# Patient Record
Sex: Female | Born: 1940 | Race: White | Hispanic: No | Smoking: Current some day smoker
Health system: Southern US, Academic
[De-identification: ages and names within clinical notes are randomized; demographics above are authoritative.]

## PROBLEM LIST (undated history)

## (undated) DIAGNOSIS — J449 Chronic obstructive pulmonary disease, unspecified: Secondary | ICD-10-CM

## (undated) DIAGNOSIS — I639 Cerebral infarction, unspecified: Secondary | ICD-10-CM

## (undated) DIAGNOSIS — I252 Old myocardial infarction: Secondary | ICD-10-CM

## (undated) DIAGNOSIS — I10 Essential (primary) hypertension: Secondary | ICD-10-CM

## (undated) HISTORY — PX: HX TAH AND BSO: SHX83

## (undated) HISTORY — PX: HX APPENDECTOMY: SHX54

---

## 1994-05-26 ENCOUNTER — Other Ambulatory Visit (HOSPITAL_COMMUNITY): Payer: Self-pay

## 2021-05-07 ENCOUNTER — Encounter (HOSPITAL_PSYCHIATRIC): Payer: Self-pay | Admitting: PHYSICIAN ASSISTANT

## 2021-05-07 DIAGNOSIS — F410 Panic disorder [episodic paroxysmal anxiety] without agoraphobia: Secondary | ICD-10-CM | POA: Insufficient documentation

## 2021-05-07 DIAGNOSIS — F411 Generalized anxiety disorder: Secondary | ICD-10-CM | POA: Insufficient documentation

## 2021-05-07 DIAGNOSIS — F39 Unspecified mood [affective] disorder: Secondary | ICD-10-CM

## 2021-05-07 DIAGNOSIS — G4700 Insomnia, unspecified: Secondary | ICD-10-CM | POA: Insufficient documentation

## 2021-05-07 NOTE — Progress Notes (Signed)
HPI  Behavioral Health-AMB  HPI: Ms. Michele Fitzgerald is a 81 year old white female who is here to follow-up for ongoing treatment of anxiety and depression.  This is the patient's first office visit since being in the hospital.  The patient was in the hospital here in September and medications were added and adjusted.  The patient was placed on Ativan which has helped her a great deal.  She has end-stage COPD and struggling a lot with breathing, she is on continuous oxygen and takes breathing treatments regularly.  Today, the patient is very jovial and seemingly well controlled.  She does not appear to be depressed or anxious.  We talked about her medications and talked about the methodology and pathophysiology of her COPD.  She seems to have a good grasp on what is going on with her and why she is so sick and when she gets overwhelmed.  We talked about new coping skills and breathing techniques.    PHQ-9 score today is 13      Diagnosis:    Axis 1:1.  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  Generalized anxiety disorder, 3.  Insomnia 4.  Mood disorder unspecified    Axis 2: Deferred    Axis 3: Allergy to aspirin, codeine, propoxyphene, opiate agonists, Darvon.  History of COPD and uses O2, CVA, hyperlipidemia, hypertension, hypokalemia, hyponatremia, COVID-19.  Surgical history includes hysterectomy, cataract removal, C-section.  No history of head trauma, LOC or seizure disorder.      A/P  Assessment & Plan  (1) Panic disorder:        Status: Acute        Plan:  Moderate level of medical decision making, discussion of multiple diagnosis and symptoms, review of PHQ-9, review of symptoms, patient education, discussion of prescribed medications and potential side effects, discussion of psychosocial stressors, and review of prescription monitoring program information.  Continue with current medications, follow-up in 4 months.        Code(s):  F41.0 - Panic disorder [episodic paroxysmal anxiety]  (2) GAD (generalized anxiety  disorder):        Status: Acute        Code(s):  F41.1 - Generalized anxiety disorder  (3) Mood disorder:        Status: Acute        Code(s):  F39 - Unspecified mood [affective] disorder  (4) Insomnia disorder:        Status: Acute        Code(s):  G47.00 - Insomnia, unspecified        Qualifiers:          Insomnia type: primary  Qualified Code(s): F51.01 - Primary insomnia

## 2021-06-01 ENCOUNTER — Ambulatory Visit: Payer: Medicare Other | Attending: PHYSICIAN ASSISTANT | Admitting: PHYSICIAN ASSISTANT

## 2021-06-01 ENCOUNTER — Encounter (HOSPITAL_PSYCHIATRIC): Payer: Self-pay | Admitting: PHYSICIAN ASSISTANT

## 2021-06-01 ENCOUNTER — Other Ambulatory Visit: Payer: Self-pay

## 2021-06-01 VITALS — BP 124/68 | HR 64 | Resp 20 | Ht 60.0 in | Wt 124.0 lb

## 2021-06-01 DIAGNOSIS — F411 Generalized anxiety disorder: Secondary | ICD-10-CM | POA: Insufficient documentation

## 2021-06-01 DIAGNOSIS — F410 Panic disorder [episodic paroxysmal anxiety] without agoraphobia: Secondary | ICD-10-CM | POA: Insufficient documentation

## 2021-06-01 DIAGNOSIS — G4700 Insomnia, unspecified: Secondary | ICD-10-CM

## 2021-06-01 DIAGNOSIS — F39 Unspecified mood [affective] disorder: Secondary | ICD-10-CM | POA: Insufficient documentation

## 2021-06-01 MED ORDER — TRAZODONE 50 MG TABLET
50.0000 mg | ORAL_TABLET | Freq: Every evening | ORAL | 2 refills | Status: DC
Start: 2021-06-01 — End: 2021-08-02

## 2021-06-01 MED ORDER — ROPINIROLE 0.5 MG TABLET
0.5000 mg | ORAL_TABLET | Freq: Two times a day (BID) | ORAL | 2 refills | Status: DC
Start: 2021-06-01 — End: 2021-08-02

## 2021-06-01 MED ORDER — LORAZEPAM 0.5 MG TABLET
0.5000 mg | ORAL_TABLET | Freq: Two times a day (BID) | ORAL | 2 refills | Status: DC | PRN
Start: 2021-06-01 — End: 2021-08-02

## 2021-06-01 MED ORDER — FLUOXETINE 10 MG CAPSULE
10.0000 mg | ORAL_CAPSULE | Freq: Every day | ORAL | 2 refills | Status: DC
Start: 2021-06-01 — End: 2021-08-02

## 2021-06-01 NOTE — Progress Notes (Signed)
HPI  Behavioral Health-AMB  HPI: Michele Fitzgerald is a 81 year old white female who is here to follow-up for ongoing treatment of anxiety and depression.   patient presents today with her cousin.  She states her blood pressure is still difficult to control.  She asked me if her Prozac or any of her other medications could be causing her blood pressure issues.  I told her that not suspect 10 mg of Prozac to be lowering her pressure.  I told her that Ativan can certainly lower pressure however.  I will keep her at 1 mg daily for Requip for restless leg issues.  We will do 0.5 b.i.d..       PHQ-9 score today is 13      Diagnosis:    Axis 1:1.  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  Generalized anxiety disorder, 3.  Insomnia 4.  Mood disorder unspecified    Axis 2: Deferred    Axis 3: Allergy to aspirin, codeine, propoxyphene, opiate agonists, Darvon.  History of COPD and uses O2, CVA, hyperlipidemia, hypertension, hypokalemia, hyponatremia, COVID-19.  Surgical history includes hysterectomy, cataract removal, C-section.  No history of head trauma, LOC or seizure disorder.    Review of Systems:     All systems reviewed & are unremarkable except as noted in HPI and below  Constitutional: Yes alert, Yes alert and oriented x4 and Yes appears well  HENT: Yes normal HENT inspection and Yes hearing grossly normal bilaterally  Respiratory: Yes normal respiratory effort, No no respiratory distress and Yes lungs clear  Cardiovascular:  No cardiac complaints, no chest pain  Gastrointestinal:  No GI complaints  Skin:  Warm,  dry, no rashes  Neurological:  No focal neurological deficits        MSE:  The patient is alert and oriented x4, casually dressed, good eye contact, well groomed, appearing stated age.  Speech is normal rate and tone.  Patient is talkative and personable. There is no flight of ideas, loosening of associations, or tangential speech.  Not manic.  Mood is euthymic with no complaints.  Affect congruent.   Patient does not appear to be in any acute physical distress.  No suicidal or homicidal ideation.  No auditory or visual hallucinations, no delusions, no paranoia.  No signs of psychosis.  No plans to harm self or others.  Patient is not aggressive or threatening.  No psychomotor agitation.  No psychomotor retardation.  No abnormal involuntary movements. Thoughts are linear, logical, and goal directed.  Intellectual functioning is good.  Memory is intact to recent, remote, and past events.  Patient can recall 3 of 3 objects at 0 and 5 minutes, and what was eaten for last meal.  Patient is able to provide details of current situation.  Patient can name the president, vice president, and governor.  Language is good.  Vocabulary is unimpaired, no word finding difficulty or word misuse.  Intelligence is good, patient can interpret a proverb, and reports apple and orange similarity.  Calculation is unimpaired.  Concentration is good, able to recite days of week forward and backward.  Insight is good; patient is aware of their illness, how it affects their functioning, and what needs to happen for future improvement.  Judgment is good; patient is compliant with treatment and can relate appropriately to what they would do if smelling smoke in a theater or finding stamped addressed envelope.            A/P  Assessment & Plan  (1)  Panic disorder:        Status: Acute        Plan:        Code(s):  F41.0 - Panic disorder [episodic paroxysmal anxiety]  (2) GAD (generalized anxiety disorder):        Status: Acute        Code(s):  F41.1 - Generalized anxiety disorder  (3) Mood disorder:        Status: Acute        Code(s):  F39 - Unspecified mood [affective] disorder  (4) Insomnia disorder:        Status: Acute        Code(s):  G47.00 - Insomnia, unspecified        Qualifiers:          Insomnia type: primary  Qualified Code(s): F51.01 - Primary insomnia

## 2021-06-01 NOTE — Patient Instructions (Addendum)
Moderate level of medical decision making, discussion of multiple diagnosis and symptoms, review of PHQ-9, review of symptoms, patient education, discussion of prescribed medications and potential side effects, discussion of psychosocial stressors, and review of prescription monitoring program information.  Continue with current medications, follow-up in 2 months.  Increase Requip to 0.5 mg b.i.d. for now

## 2021-07-02 ENCOUNTER — Telehealth (HOSPITAL_PSYCHIATRIC): Payer: Self-pay | Admitting: PHYSICIAN ASSISTANT

## 2021-07-02 NOTE — Telephone Encounter (Signed)
Patient called and is needing a refill for Ativan.    [facility]      [phone]

## 2021-07-02 NOTE — Telephone Encounter (Signed)
Called [facility] in [place] because a prescription should be on file. Per pharmacy tech, prescription was on file and they will fill prescription for patient. Patient called and notified of this and she verbalized understanding.

## 2021-08-02 ENCOUNTER — Ambulatory Visit: Payer: Medicare Other | Attending: PHYSICIAN ASSISTANT | Admitting: PHYSICIAN ASSISTANT

## 2021-08-02 ENCOUNTER — Encounter (HOSPITAL_PSYCHIATRIC): Payer: Self-pay | Admitting: PHYSICIAN ASSISTANT

## 2021-08-02 ENCOUNTER — Other Ambulatory Visit: Payer: Self-pay

## 2021-08-02 VITALS — BP 126/78 | HR 78 | Resp 20 | Ht 60.0 in | Wt 125.0 lb

## 2021-08-02 DIAGNOSIS — F39 Unspecified mood [affective] disorder: Secondary | ICD-10-CM | POA: Insufficient documentation

## 2021-08-02 DIAGNOSIS — G4700 Insomnia, unspecified: Secondary | ICD-10-CM | POA: Insufficient documentation

## 2021-08-02 DIAGNOSIS — F411 Generalized anxiety disorder: Secondary | ICD-10-CM | POA: Insufficient documentation

## 2021-08-02 DIAGNOSIS — F410 Panic disorder [episodic paroxysmal anxiety] without agoraphobia: Secondary | ICD-10-CM | POA: Insufficient documentation

## 2021-08-02 MED ORDER — LORAZEPAM 0.5 MG TABLET
0.5000 mg | ORAL_TABLET | Freq: Two times a day (BID) | ORAL | 2 refills | Status: DC | PRN
Start: 2021-08-02 — End: 2021-11-02

## 2021-08-02 MED ORDER — TRAZODONE 50 MG TABLET
50.0000 mg | ORAL_TABLET | Freq: Every evening | ORAL | 2 refills | Status: DC
Start: 2021-08-02 — End: 2022-05-03

## 2021-08-02 MED ORDER — ROPINIROLE 0.5 MG TABLET
0.5000 mg | ORAL_TABLET | Freq: Two times a day (BID) | ORAL | 2 refills | Status: DC
Start: 2021-08-02 — End: 2022-02-01

## 2021-08-02 MED ORDER — FLUOXETINE 10 MG CAPSULE
10.0000 mg | ORAL_CAPSULE | Freq: Every day | ORAL | 2 refills | Status: DC
Start: 2021-08-02 — End: 2021-11-02

## 2021-08-02 NOTE — Progress Notes (Signed)
[facility]  BEHAVIORAL MEDICINE, [facility] [place]  Operated by [facility]  Progress Note    Name: Michele Fitzgerald MRN:  [id number]   Date: 08/02/2021 Age: 81 y.o.       Chief Complaint: Generalized Anxiety, Panic Disorder, Insomnia, and Depression (Mild Mood Disorder)    Subjective:   HPI  Behavioral Health-AMB  HPI:   Michele Fitzgerald is a 81 year old white female who is here to follow-up for ongoing treatment of anxiety and depression.   patient states she is doing pretty good today.  She states that she is doing well with her medications and she is less agitated less restless.  She admits to me today that she did not quit smoking delay the doctors wanted her to.  She did not feel like it was right lying to me.  She had been struggling with blood pressure issues now thinks that she would get really anxious when she came to the physician because of that.  Her blood pressure today is 126/78.  I will continue with current medications, will see her back in 3 months.  Sooner if needed.     PHQ-9 score today is 8    Review of Systems:     All systems reviewed & are unremarkable except as noted in HPI and below  Constitutional: Yes alert, Yes alert and oriented x4 and Yes appears well  HENT: Yes normal HENT inspection and Yes hearing grossly normal bilaterally  Respiratory: Yes normal respiratory effort, No no respiratory distress and Yes lungs clear  Cardiovascular:  No cardiac complaints, no chest pain  Gastrointestinal:  No GI complaints  Skin:  Warm,  dry, no rashes  Neurological:  No focal neurological deficits      MSE:  The patient is alert and oriented x4, casually dressed, good eye contact, well groomed, appearing stated age.  Speech is normal rate and tone.  Patient is talkative and personable. There is no flight of ideas, loosening of associations, or tangential speech.  Not manic.  Mood is euthymic with no complaints.  Affect congruent.  Patient does not appear to  be in any acute physical distress.  No suicidal or homicidal ideation.  No auditory or visual hallucinations, no delusions, no paranoia.  No signs of psychosis.  No plans to harm self or others.  Patient is not aggressive or threatening.  No psychomotor agitation.  No psychomotor retardation.  No abnormal involuntary movements. Thoughts are linear, logical, and goal directed.  Intellectual functioning is good.  Memory is intact to recent, remote, and past events.  Patient can recall 3 of 3 objects at 0 and 5 minutes, and what was eaten for last meal.  Patient is able to provide details of current situation.  Patient can name the president, vice president, and governor.  Language is good.  Vocabulary is unimpaired, no word finding difficulty or word misuse.  Intelligence is good, patient can interpret a proverb, and reports apple and orange similarity.  Calculation is unimpaired.  Concentration is good, able to recite days of week forward and backward.  Insight is good; patient is aware of their illness, how it affects their functioning, and what needs to happen for future improvement.  Judgment is good; patient is compliant with treatment and can relate appropriately to what they would do if smelling smoke in a theater or finding stamped addressed envelope.          Diagnosis:    Axis 1:1.  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  Generalized anxiety disorder, 3.  Insomnia 4.  Mood disorder unspecified    Axis 2: Deferred    Axis 3: Allergy to aspirin, codeine, propoxyphene, opiate agonists, Darvon.  History of COPD and uses O2, CVA, hyperlipidemia, hypertension, hypokalemia, hyponatremia, COVID-19.  Surgical history includes hysterectomy, cataract removal, C-section.  No history of head trauma, LOC or seizure disorder.    Objective :  BP 126/78 (Site: Right, Patient Position: Sitting)   Pulse 78   Resp 20   Ht 1.524 m (5')   Wt 56.7 kg (125 lb)   SpO2 99% Comment: 3LMP O2  BMI 24.41 kg/m     Data  reviewed:    Current Outpatient Medications   Medication Sig   . albuterol sulfate (PROVENTIL OR VENTOLIN OR PROAIR) 90 mcg/actuation Inhalation oral inhaler Take 1-2 Puffs by inhalation Every 6 hours as needed   . ascorbic acid, vitamin C, (VITAMIN C) 500 mg Oral Tablet Take 1 Tablet (500 mg total) by mouth Once a day   . budesonide-glycopyr-formoterol (BREZTRI AEROSPHERE) 160-9-4.8 mcg/actuation Inhalation HFA Aerosol Inhaler Take 2 Puffs by inhalation Twice daily   . clopidogreL (PLAVIX) 75 mg Oral Tablet Take 1 Tablet (75 mg total) by mouth Once a day   . cyanocobalamin (VITAMIN B 12) 1,000 mcg Oral Tablet Take 1 Tablet (1,000 mcg total) by mouth Once a day   . FLUoxetine (PROZAC) 10 mg Oral Capsule Take 1 Capsule (10 mg total) by mouth Once a day   . hydroCHLOROthiazide (HYDRODIURIL) 25 mg Oral Tablet Take 1 Tablet (25 mg total) by mouth Once a day   . IPRATROPIUM 0.5 MG-ALBUTEROL 3 MG (2.5 MG BASE)/3 ML NEBULIZATION SOLN Take 3 mL by nebulization Every 6 hours as needed for Wheezing   . LORazepam (ATIVAN) 0.5 mg Oral Tablet Take 1 Tablet (0.5 mg total) by mouth Twice per day as needed for Anxiety   . Potassium Gluconate 2.5 mEq Oral Tablet Take 1 Tablet (2.5 mEq total) by mouth Once a day   . rOPINIRole (REQUIP) 0.5 mg Oral Tablet Take 1 Tablet (0.5 mg total) by mouth Twice daily   . rosuvastatin (CRESTOR) 20 mg Oral Tablet Take 1 Tablet (20 mg total) by mouth Every evening   . traZODone (DESYREL) 50 mg Oral Tablet Take 1 Tablet (50 mg total) by mouth Every night     Assessment/Plan  Moderate level of medical decision making, discussion of multiple diagnosis and symptoms, review of PHQ-9, review of symptoms, patient education, discussion of prescribed medications and potential side effects, discussion of psychosocial stressors, and review of prescription monitoring program information.    Take medications as prescribed, avoid drugs and alcohol, call office if symptoms worsen or problems arise.   [phone].      Lou Cal, PA-C

## 2021-11-02 ENCOUNTER — Encounter (HOSPITAL_PSYCHIATRIC): Payer: Self-pay | Admitting: PHYSICIAN ASSISTANT

## 2021-11-02 ENCOUNTER — Other Ambulatory Visit: Payer: Self-pay

## 2021-11-02 ENCOUNTER — Ambulatory Visit: Payer: Medicare Other | Attending: PHYSICIAN ASSISTANT | Admitting: PHYSICIAN ASSISTANT

## 2021-11-02 VITALS — BP 140/76 | HR 80 | Resp 28 | Ht 60.0 in | Wt 124.0 lb

## 2021-11-02 DIAGNOSIS — F39 Unspecified mood [affective] disorder: Secondary | ICD-10-CM | POA: Insufficient documentation

## 2021-11-02 DIAGNOSIS — G4700 Insomnia, unspecified: Secondary | ICD-10-CM | POA: Insufficient documentation

## 2021-11-02 DIAGNOSIS — F411 Generalized anxiety disorder: Secondary | ICD-10-CM | POA: Insufficient documentation

## 2021-11-02 DIAGNOSIS — F410 Panic disorder [episodic paroxysmal anxiety] without agoraphobia: Secondary | ICD-10-CM | POA: Insufficient documentation

## 2021-11-02 MED ORDER — FLUOXETINE 10 MG CAPSULE
10.0000 mg | ORAL_CAPSULE | Freq: Every day | ORAL | 2 refills | Status: DC
Start: 2021-11-02 — End: 2022-02-01

## 2021-11-02 MED ORDER — LORAZEPAM 0.5 MG TABLET
0.5000 mg | ORAL_TABLET | Freq: Three times a day (TID) | ORAL | 2 refills | Status: DC | PRN
Start: 2021-11-02 — End: 2022-02-01

## 2021-11-02 NOTE — Progress Notes (Signed)
[facility]  BEHAVIORAL MEDICINE, [facility] [place]  Operated by [facility]  Progress Note    Name: Michele Fitzgerald MRN:  [id number]   Date: 11/02/2021 Age: 81 y.o.       Chief Complaint: Generalized Anxiety, Panic Disorder, and Insomnia    Subjective:     HPI:  Ms. Likes is   here to follow-up for management of panic disorder and generalized anxiety.  Patient states he has been having a lot more anxiety and is tremulous today in my clinic.  She has COPD and struggles with breathing and has nervous tendencies of her medical issues.  She is asking me to increase her Ativan today which I think is reasonable.  She just picked up her Ativan this past Sunday 2 days ago.  I will notify the pharmacy to see if they will refill her extra.  I will see her back in 3 months, she knows to call the office sooner should she have any problems complications.      PHQ-9 score today is 7    Review of Systems:     All systems reviewed & are unremarkable except as noted in HPI and below  Constitutional: Yes alert, Yes alert and oriented x4 and Yes appears well  HENT: Yes normal HENT inspection and Yes hearing grossly normal bilaterally  Respiratory: Yes normal respiratory effort, No no respiratory distress and Yes lungs clear  Cardiovascular:  No cardiac complaints, no chest pain  Gastrointestinal:  No GI complaints  Skin:  Warm,  dry, no rashes  Neurological:  No focal neurological deficits      MSE:  The patient is alert and oriented x4, casually dressed, good eye contact, well groomed, appearing stated age.  Speech is normal rate and tone.  Patient is talkative and personable. There is no flight of ideas, loosening of associations, or tangential speech.  Not manic.  Mood is euthymic with no complaints.  Affect congruent.  Patient does not appear to be in any acute physical distress.  No suicidal or homicidal ideation.  No auditory or visual hallucinations, no delusions, no paranoia.   No signs of psychosis.  No plans to harm self or others.  Patient is not aggressive or threatening.  No psychomotor agitation.  No psychomotor retardation.  No abnormal involuntary movements. Thoughts are linear, logical, and goal directed.  Intellectual functioning is good.  Memory is intact to recent, remote, and past events.  Patient can recall 3 of 3 objects at 0 and 5 minutes, and what was eaten for last meal.  Patient is able to provide details of current situation.  Patient can name the president, vice president, and governor.  Language is good.  Vocabulary is unimpaired, no word finding difficulty or word misuse.  Intelligence is good, patient can interpret a proverb, and reports apple and orange similarity.  Calculation is unimpaired.  Concentration is good, able to recite days of week forward and backward.  Insight is good; patient is aware of their illness, how it affects their functioning, and what needs to happen for future improvement.  Judgment is good; patient is compliant with treatment and can relate appropriately to what they would do if smelling smoke in a theater or finding stamped addressed envelope.            Diagnosis:     Axis 1:1.  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  Generalized anxiety disorder, 3.  Insomnia 4.  Mood disorder unspecified  Axis 2: Deferred     Axis 3: Allergy to aspirin, codeine, propoxyphene, opiate agonists, Darvon.  History of COPD and uses O2, CVA, hyperlipidemia, hypertension, hypokalemia, hyponatremia, COVID-19.  Surgical history includes hysterectomy, cataract removal, C-section.  No history of head trauma, LOC or seizure disorder.    Allergies:  Darvon  Bactrim  Codine  Vistaril  Asprin    Medication  Requip 0.5mg  BID  Ativan 0.5mg  TID  Prozac 10 mg Daily  Trazodone 50 mg HS      Pharmacy:  [facility]      Objective :  BP (!) 140/76 (Site: Left, Patient Position: Sitting, Cuff Size: Adult)   Pulse 80   Resp (!) 28   Ht 1.524 m (5')   Wt 56.2  kg (124 lb)   BMI 24.22 kg/m     Data reviewed:    Current Outpatient Medications   Medication Sig    albuterol sulfate (PROVENTIL OR VENTOLIN OR PROAIR) 90 mcg/actuation Inhalation oral inhaler Take 1-2 Puffs by inhalation Every 6 hours as needed    ascorbic acid, vitamin C, (VITAMIN C) 500 mg Oral Tablet Take 1 Tablet (500 mg total) by mouth Once a day    budesonide-glycopyr-formoterol (BREZTRI AEROSPHERE) 160-9-4.8 mcg/actuation Inhalation HFA Aerosol Inhaler Take 2 Puffs by inhalation Twice daily    clopidogreL (PLAVIX) 75 mg Oral Tablet Take 1 Tablet (75 mg total) by mouth Once a day    cyanocobalamin (VITAMIN B 12) 1,000 mcg Oral Tablet Take 1 Tablet (1,000 mcg total) by mouth Once a day    FLUoxetine (PROZAC) 10 mg Oral Capsule Take 1 Capsule (10 mg total) by mouth Once a day    hydroCHLOROthiazide (HYDRODIURIL) 25 mg Oral Tablet Take 1 Tablet (25 mg total) by mouth Once a day    IPRATROPIUM 0.5 MG-ALBUTEROL 3 MG (2.5 MG BASE)/3 ML NEBULIZATION SOLN Take 3 mL by nebulization Every 6 hours as needed for Wheezing    LORazepam (ATIVAN) 0.5 mg Oral Tablet Take 1 Tablet (0.5 mg total) by mouth Twice per day as needed for Anxiety    Potassium Gluconate 2.5 mEq Oral Tablet Take 1 Tablet (2.5 mEq total) by mouth Once a day    rOPINIRole (REQUIP) 0.5 mg Oral Tablet Take 1 Tablet (0.5 mg total) by mouth Twice daily    rosuvastatin (CRESTOR) 20 mg Oral Tablet Take 1 Tablet (20 mg total) by mouth Every evening    traZODone (DESYREL) 50 mg Oral Tablet Take 1 Tablet (50 mg total) by mouth Every night     Assessment/Plan  Moderate level of medical decision making, discussion of multiple diagnosis and symptoms, review of PHQ-9, review of symptoms, patient education, discussion of prescribed medications and potential side effects, discussion of psychosocial stressors, and review of prescription monitoring program information.    Increase Ativan 0.5 TID    Take medications as prescribed, avoid drugs and alcohol, call office  if symptoms worsen or problems arise.  [phone].    Called [facility] pharmacy and the pharmacist told me that the patient can start taking her medication 3 times a day in when she runs out she can come get the new prescription filled.      Lou Cal, PA-C

## 2021-12-02 IMAGING — MR MRI BRAIN W/O CONTRAST
9 series · 48 of 48 positions shown · non-contrast
Comparison: None available.

﻿EXAM:  MRI BRAIN W/O CONTRAST
INDICATION: Amnesia, headaches.  History of new onset of Parkinson’s.
TECHNIQUE: Noncontrast multiplanar, multisequence MRI was performed.

[Series 5: DWI · axial · 5.0mm · 1.35mm/px · z∈[-30,+96]mm · 16 of 88 slices shown (1 of 3)]
[im 1/88]
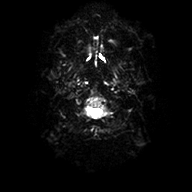
[im 6/88]
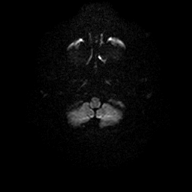
[im 12/88]
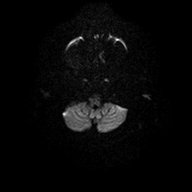
[im 18/88]
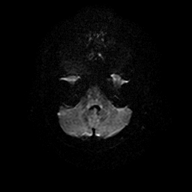
[im 24/88]
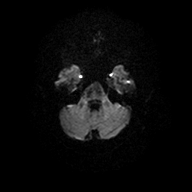
[im 30/88]
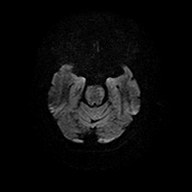
[im 35/88]
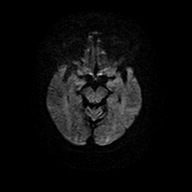
[im 41/88]
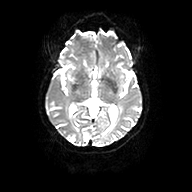
[im 47/88]
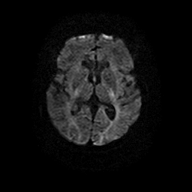
[im 53/88]
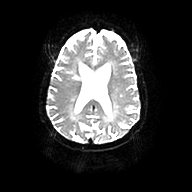
[im 59/88]
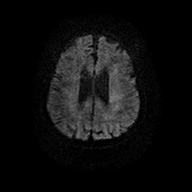
[im 64/88]
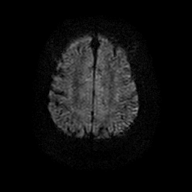
[im 70/88]
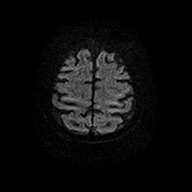
[im 76/88]
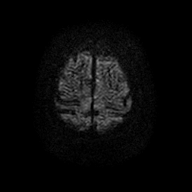
[im 82/88]
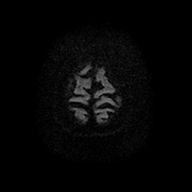
[im 88/88]
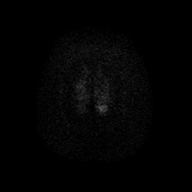

[Series 6: DWI · axial · 5.0mm · 1.35mm/px · z∈[-30,+96]mm · 4 of 22 slices shown (2 of 3)]
[im 1/22]
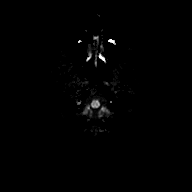
[im 8/22]
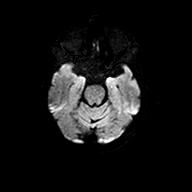
[im 15/22]
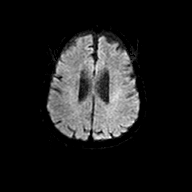
[im 22/22]
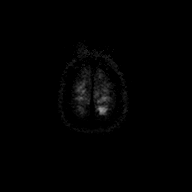

[Series 7: DWI · axial · 5.0mm · 1.35mm/px · z∈[-30,+96]mm · 4 of 22 slices shown (3 of 3)]
[im 1/22]
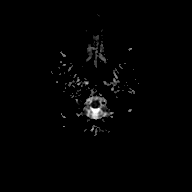
[im 8/22]
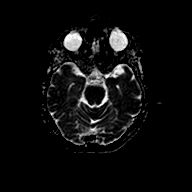
[im 15/22]
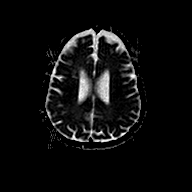
[im 22/22]
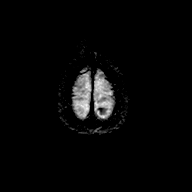

[Series 8: FLAIR · sagittal · 4.0mm · 0.75mm/px · 4 of 26 slices shown (1 of 2)]
[im 1/26]
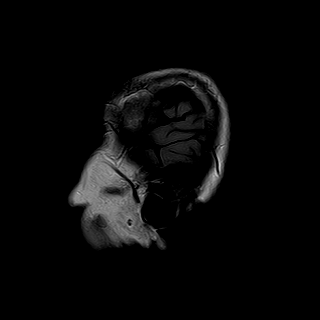
[im 9/26]
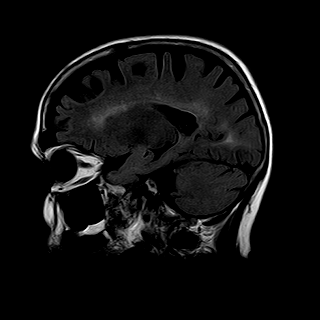
[im 17/26]
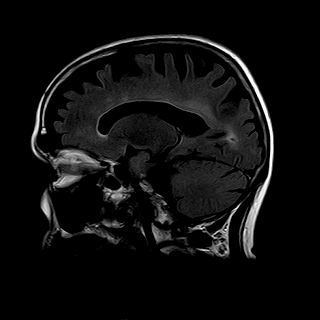
[im 26/26]
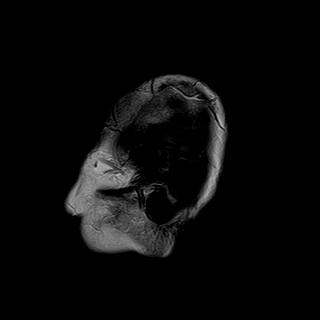

[Series 9: T2 · axial · 5.0mm · 0.43mm/px · z∈[-37,+107]mm · 4 of 25 slices shown (1 of 2)]
[im 1/25]
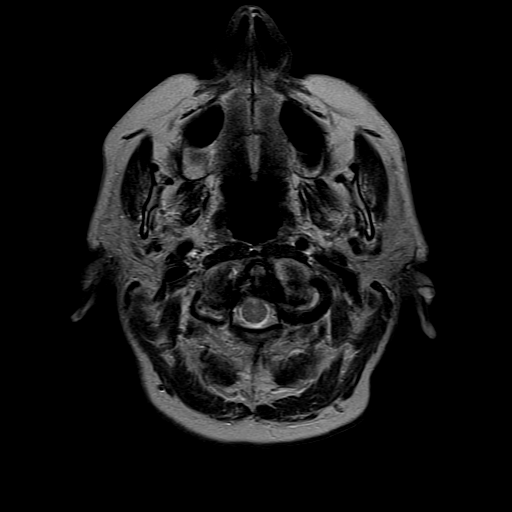
[im 9/25]
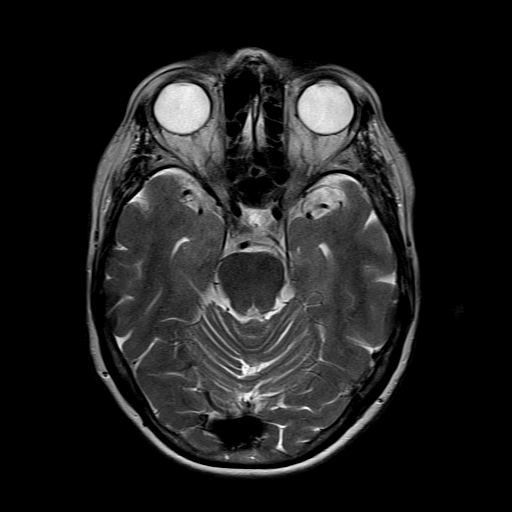
[im 17/25]
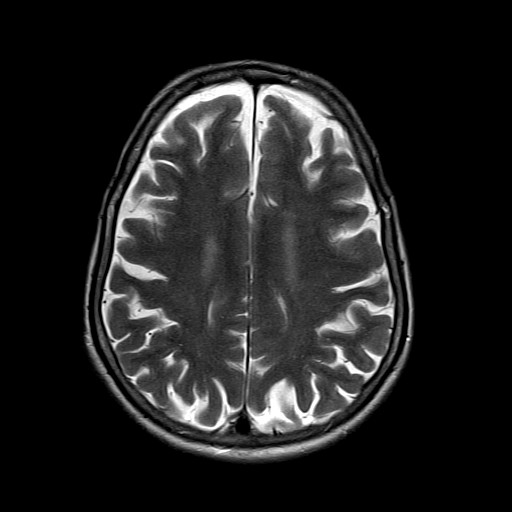
[im 25/25]
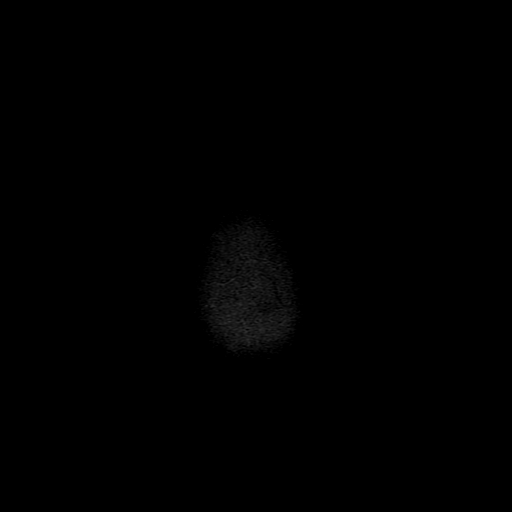

[Series 10: FLAIR · axial · 5.0mm · 0.76mm/px · z∈[-28,+98]mm · 4 of 22 slices shown (2 of 2)]
[im 1/22]
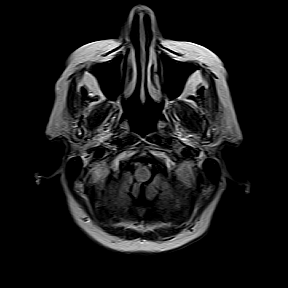
[im 8/22]
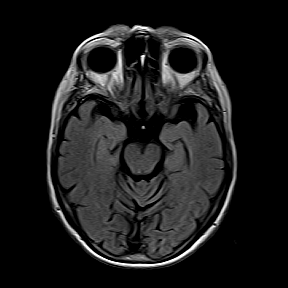
[im 15/22]
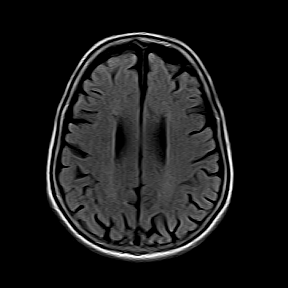
[im 22/22]
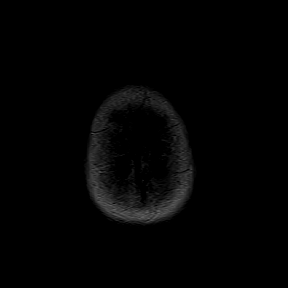

[Series 11: T1 · axial · 5.0mm · 0.69mm/px · z∈[-37,+107]mm · 4 of 25 slices shown]
[im 1/25]
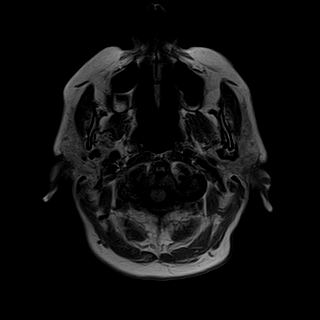
[im 9/25]
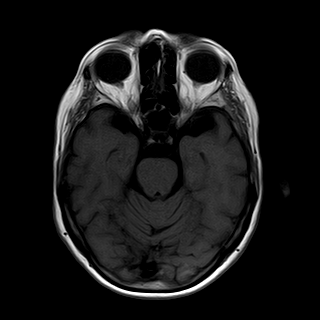
[im 17/25]
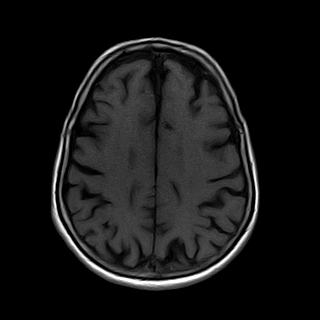
[im 25/25]
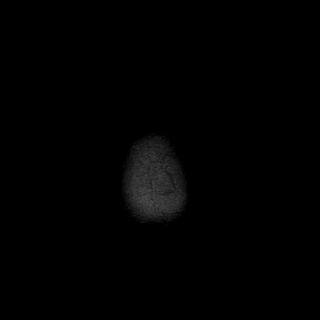

[Series 12: T2-star · axial · 5.0mm · 0.69mm/px · z∈[-37,+107]mm · 4 of 25 slices shown]
[im 1/25]
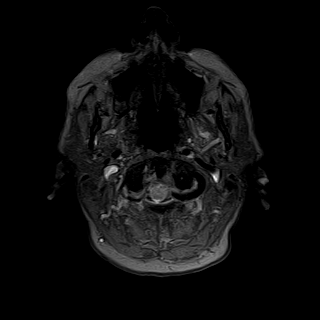
[im 9/25]
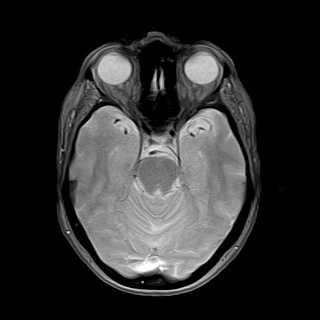
[im 17/25]
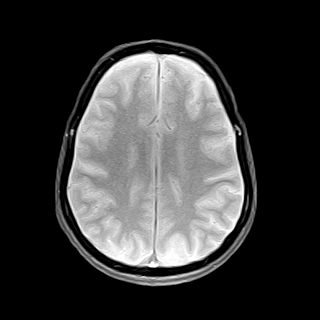
[im 25/25]
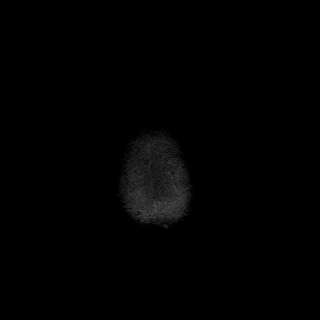

[Series 13: T2 · coronal · 6.0mm · 0.43mm/px · 4 of 24 slices shown (2 of 2)]
[im 1/24]
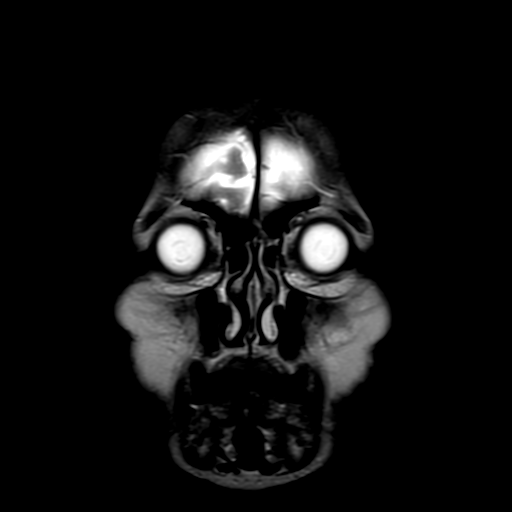
[im 8/24]
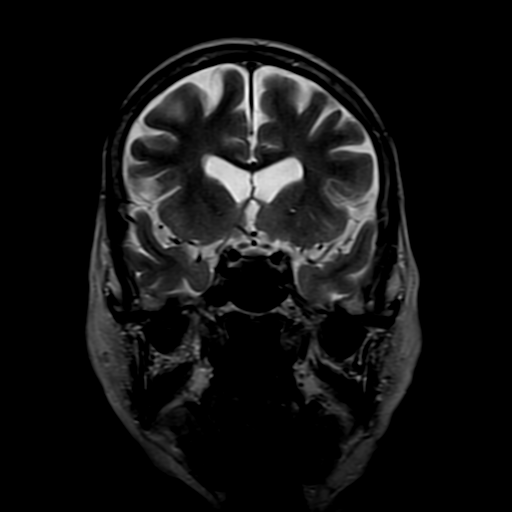
[im 16/24]
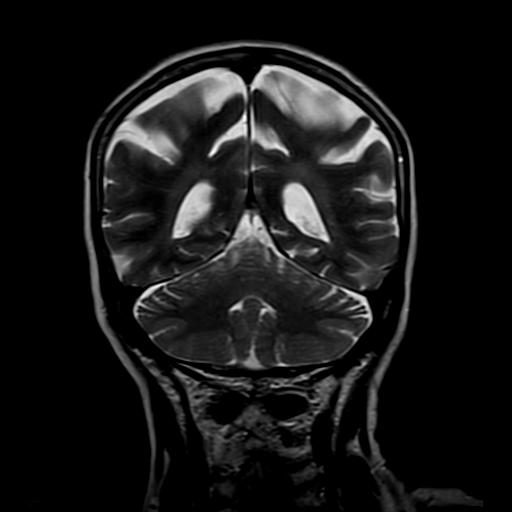
[im 24/24]
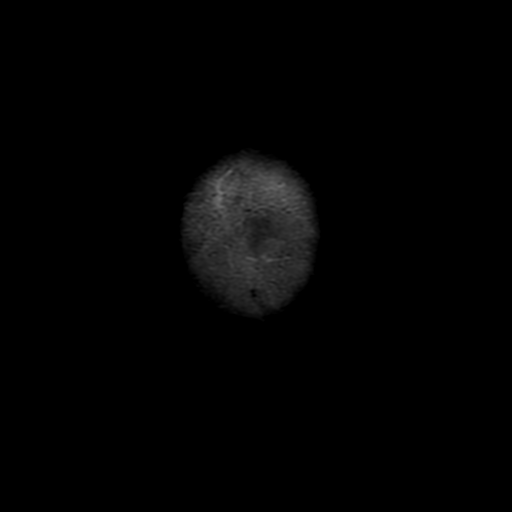

[48 of 48 positions shown; findings below may reference images not displayed]

FINDINGS: There is mild atrophy with slight prominence of the ventricles and CSF spaces.  

There is no mass, hemorrhage, shift of the midline structures, or extra-axial collection.  There is no significant white matter disease.  

The visualized paranasal sinuses and mastoid air cells appear clear.
IMPRESSION: Mild atrophy.

## 2021-12-02 IMAGING — MR MRI CERVICAL SPINE WITHOUT CONTRAST
4 of 5 series · 24 of 48 positions shown · non-contrast
Comparison: None available.

﻿EXAM:  [id number]   MRI CERVICAL SPINE WITHOUT CONTRAST
INDICATION: Neck and bilateral shoulder pain.
TECHNIQUE: Noncontrast multiplanar, multisequence MRI was performed.

[Series 5: T2 · sagittal · 3.0mm · 0.62mm/px · 8 of 13 slices shown (1 of 2)]
[im 1/13]
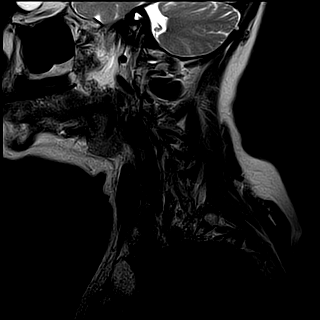
[im 2/13]
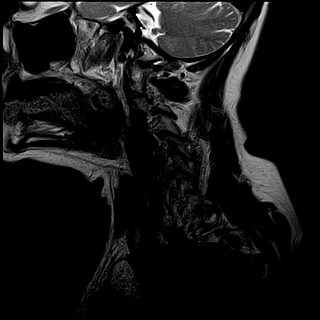
[im 4/13]
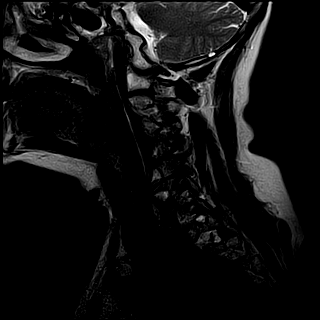
[im 6/13]
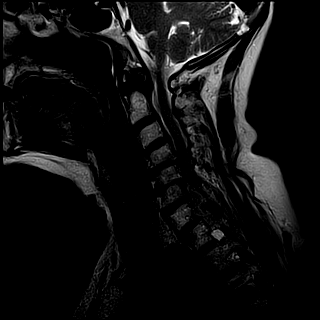
[im 7/13]
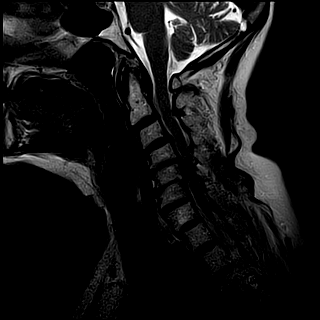
[im 9/13]
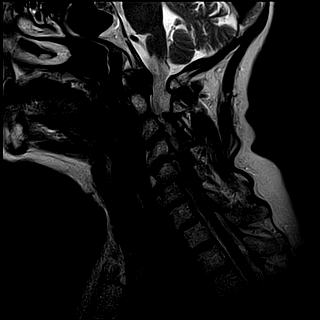
[im 11/13]
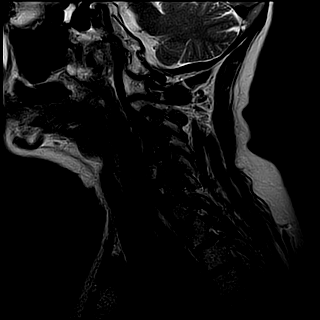
[im 13/13]
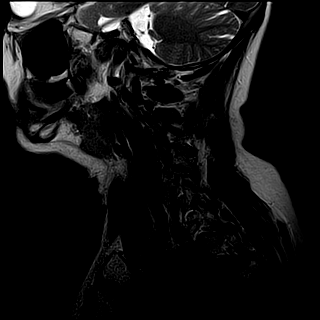

[Series 6: T1 · sagittal · 3.0mm · 0.39mm/px · 4 of 13 slices shown]
[im 1/13]
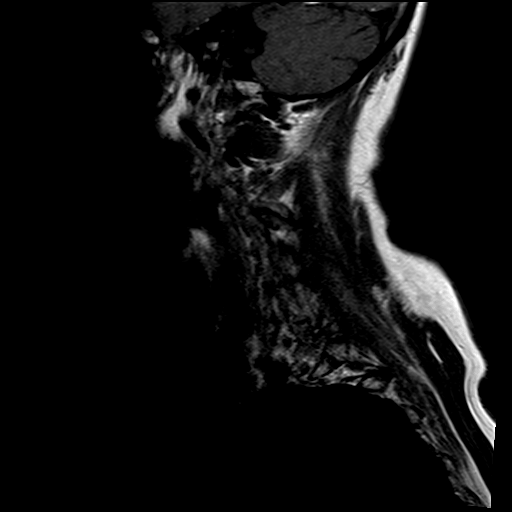
[im 2/13]
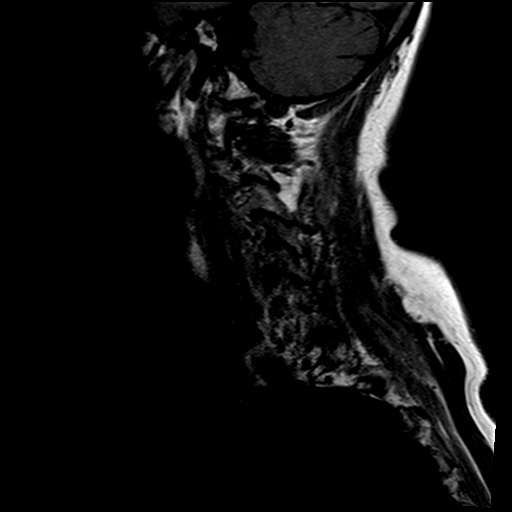
[im 7/13]
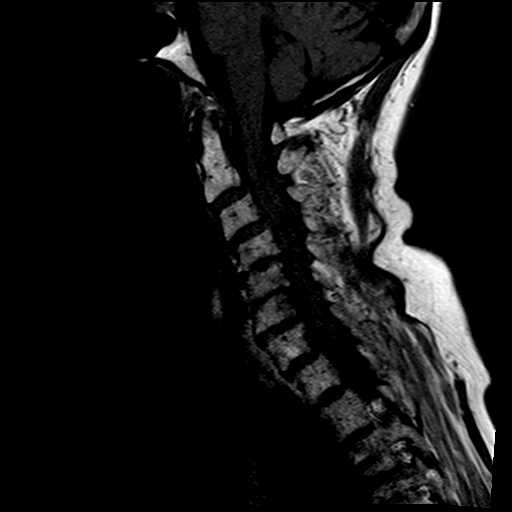
[im 11/13]
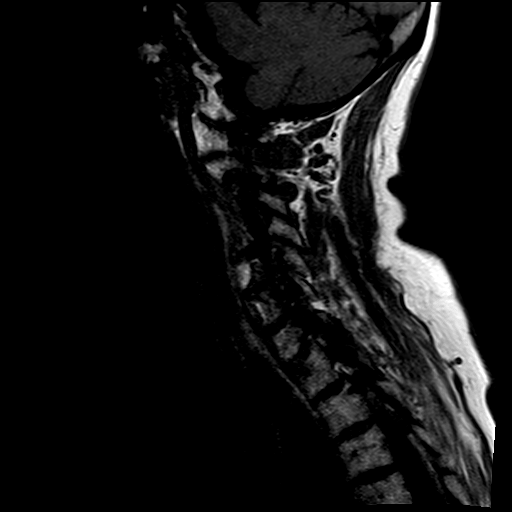

[Series 8: T2-star · axial · 3.0mm · 0.39mm/px · z∈[-120,-53]mm · 3 of 18 slices shown]
[im 4/18]
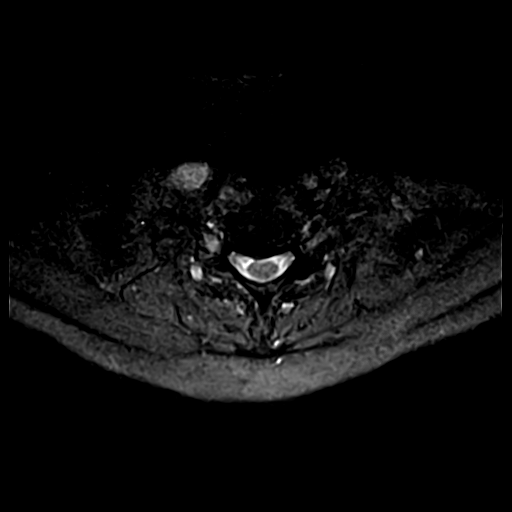
[im 10/18]
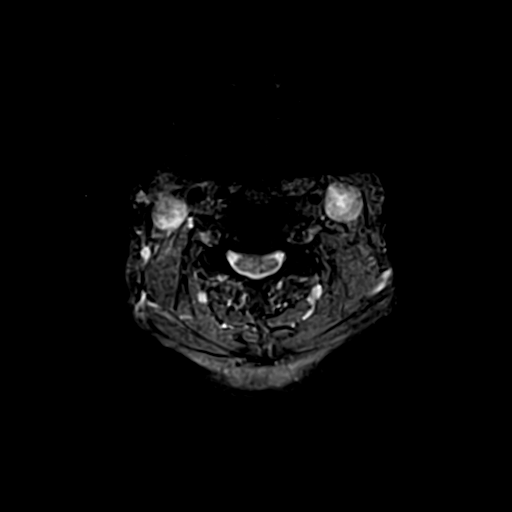
[im 16/18]
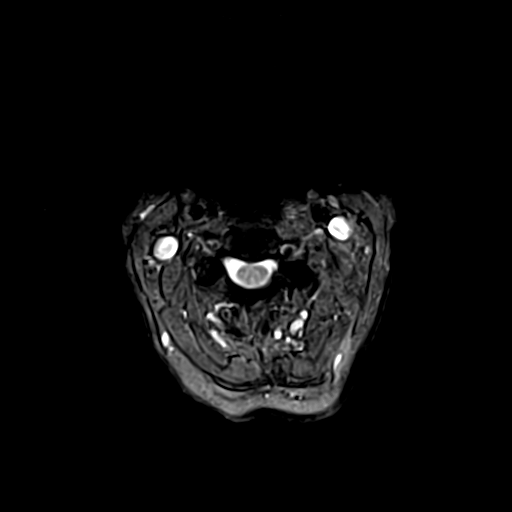

[Series 9: T2 · axial · 3.0mm · 0.39mm/px · z∈[-137,-45]mm · 9 of 18 slices shown (2 of 2)]
[im 1/18]
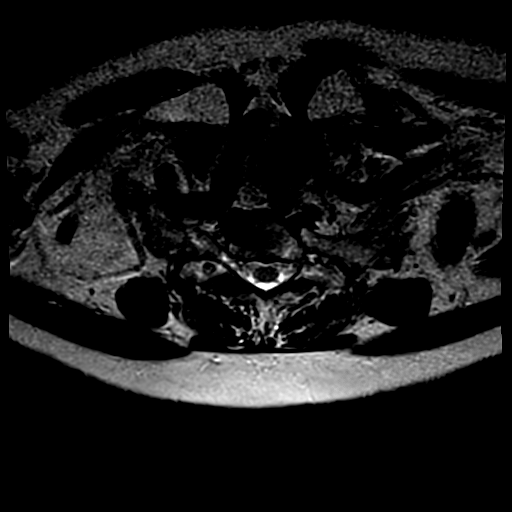
[im 4/18]
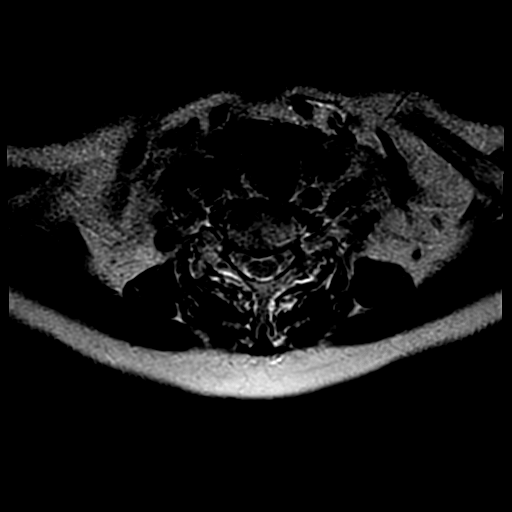
[im 5/18]
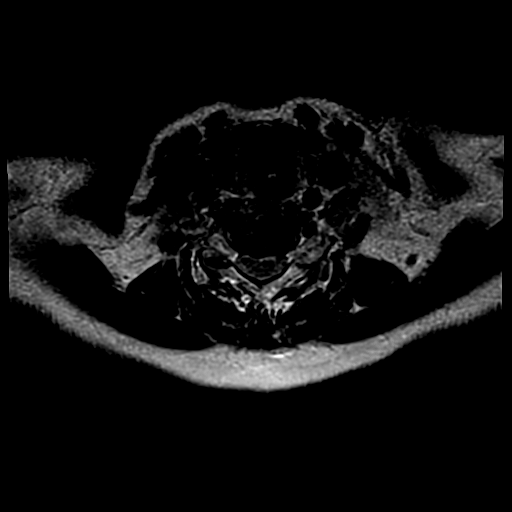
[im 8/18]
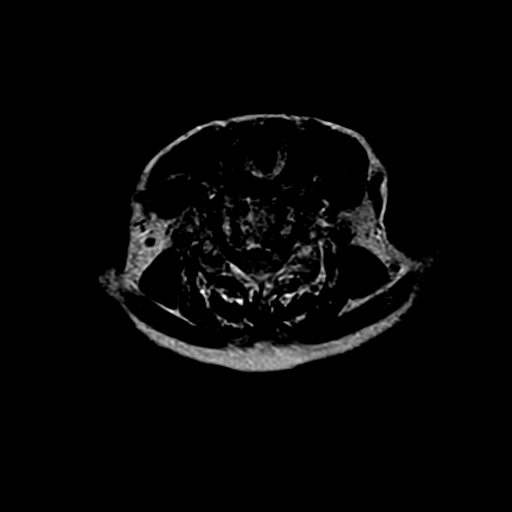
[im 10/18]
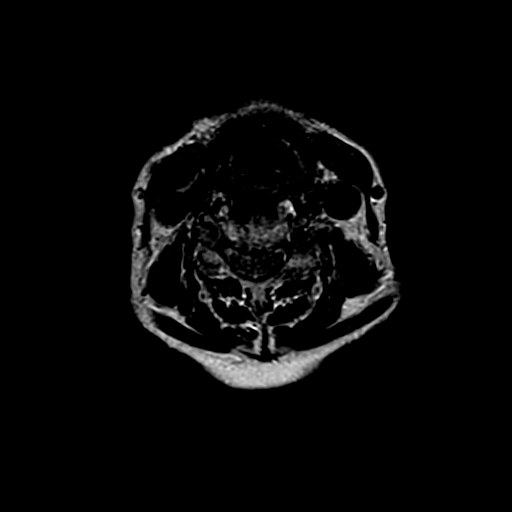
[im 13/18]
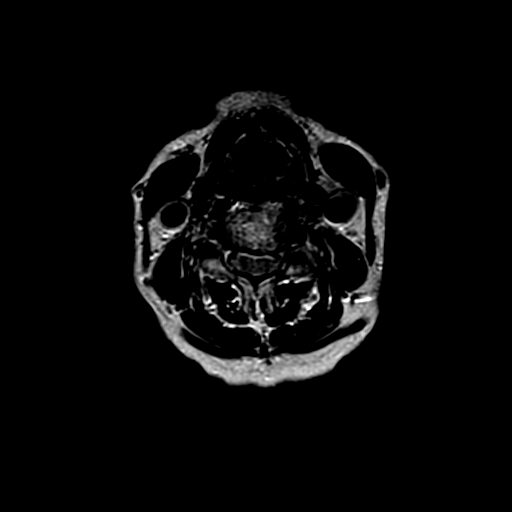
[im 14/18]
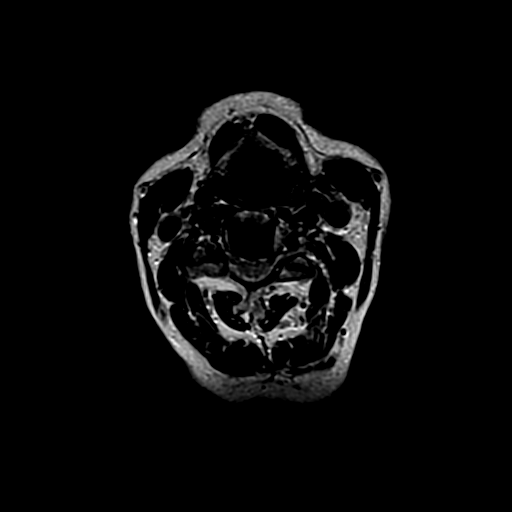
[im 16/18]
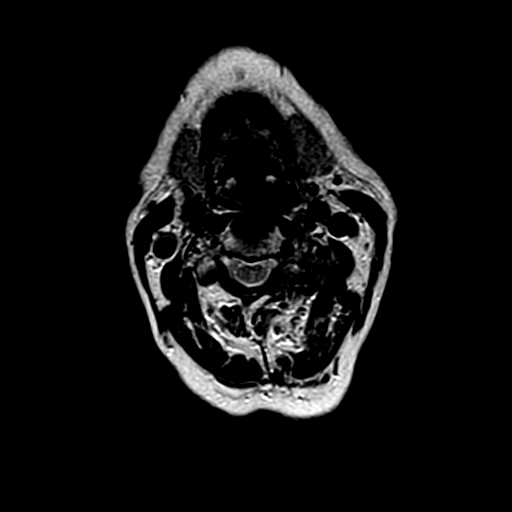
[im 18/18]
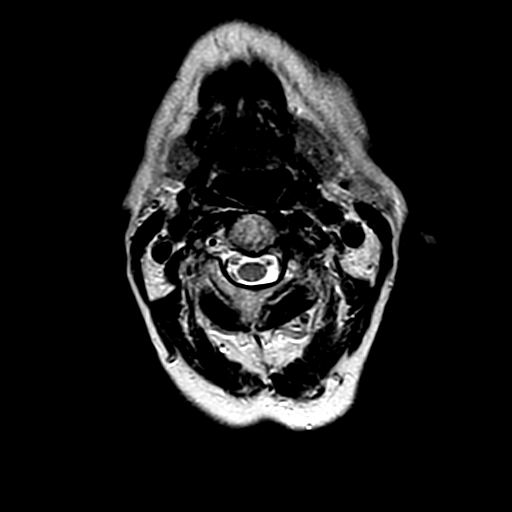

[24 of 48 positions shown; findings below may reference images not displayed]

FINDINGS: There are moderate degenerative changes at multiple levels with disc space narrowing, osteophyte formation, and neural foraminal narrowing.  Mild subluxation is compatible with degenerative change.  

At C3-C4, there is mild disc bulging.

At C4-C5, there is moderate spinal stenosis with mild compression of the spinal cord.  

At C5-C6, there is a small disc-osteophyte complex with moderate spinal stenosis and mild compression of the spinal cord.  

C6-C7 appears unremarkable.  

There is no definite myelopathy.  No fracture or significant marrow signal alteration is evident.  There is no Chiari malformation.
IMPRESSION: 1. Moderate degenerative changes.

2. Moderate C4-C5 and C5-C6 spinal stenosis.

## 2022-02-01 ENCOUNTER — Ambulatory Visit: Payer: Medicare Other | Attending: PHYSICIAN ASSISTANT | Admitting: PHYSICIAN ASSISTANT

## 2022-02-01 ENCOUNTER — Encounter (HOSPITAL_PSYCHIATRIC): Payer: Self-pay | Admitting: PHYSICIAN ASSISTANT

## 2022-02-01 ENCOUNTER — Other Ambulatory Visit: Payer: Self-pay

## 2022-02-01 VITALS — BP 158/98 | HR 87 | Resp 18 | Ht 60.0 in | Wt 124.0 lb

## 2022-02-01 DIAGNOSIS — F411 Generalized anxiety disorder: Secondary | ICD-10-CM | POA: Insufficient documentation

## 2022-02-01 DIAGNOSIS — F410 Panic disorder [episodic paroxysmal anxiety] without agoraphobia: Secondary | ICD-10-CM | POA: Insufficient documentation

## 2022-02-01 DIAGNOSIS — G4700 Insomnia, unspecified: Secondary | ICD-10-CM | POA: Insufficient documentation

## 2022-02-01 DIAGNOSIS — F39 Unspecified mood [affective] disorder: Secondary | ICD-10-CM | POA: Insufficient documentation

## 2022-02-01 MED ORDER — FLUOXETINE 10 MG CAPSULE
10.0000 mg | ORAL_CAPSULE | Freq: Every day | ORAL | 2 refills | Status: DC
Start: 2022-02-01 — End: 2022-05-03

## 2022-02-01 MED ORDER — LORAZEPAM 0.5 MG TABLET
0.5000 mg | ORAL_TABLET | Freq: Three times a day (TID) | ORAL | 2 refills | Status: DC | PRN
Start: 2022-02-01 — End: 2022-05-03

## 2022-02-01 MED ORDER — ROPINIROLE 0.5 MG TABLET
0.5000 mg | ORAL_TABLET | Freq: Two times a day (BID) | ORAL | 2 refills | Status: DC
Start: 2022-02-01 — End: 2022-05-03

## 2022-02-01 NOTE — Progress Notes (Signed)
[place], [facility] [place]  Operated by [facility]  Progress Note    Name: Michele Fitzgerald MRN:  [id number]   Date: 02/01/2022 Age: 81 y.o.       Chief Complaint: Panic Disorder, Generalized Anxiety, and Insomnia    Subjective:     HPI:  Michele Fitzgerald is   here to follow-up for management of panic disorder and generalized anxiety.  Patient states he has been doing pretty good.  She is worried about her blood pressure being up.  She states that it gets up when she gets nervous or upset or he has to do something.  She is also agitated because her insurance isn't paying for her Ativan like it was.  I have not received any type of notification from the pharmacy about it.  Overall she seems well controlled in his fairly jovial this morning.  She denies any problems complications.  I will continue her regular medications and I will see her back in 3 months.  She knows to call sooner should she have any problems.    PHQ-9 score today is 11    Review of Systems:     All systems reviewed & are unremarkable except as noted in HPI and below  Constitutional: Yes alert, Yes alert and oriented x4 and Yes appears well  HENT: Yes normal HENT inspection and Yes hearing grossly normal bilaterally  Respiratory: Yes normal respiratory effort, No no respiratory distress and Yes lungs clear  Cardiovascular:  No cardiac complaints, no chest pain  Gastrointestinal:  No GI complaints  Skin:  Warm,  dry, no rashes  Neurological:  No focal neurological deficits      MSE:  The patient is alert and oriented x4, casually dressed, good eye contact, well groomed, appearing stated age.  Speech is normal rate and tone.  Patient is talkative and personable. There is no flight of ideas, loosening of associations, or tangential speech.  Not manic.  Mood is euthymic with no complaints.  Affect congruent.  Patient does not appear to be in any acute physical distress.  No suicidal or  homicidal ideation.  No auditory or visual hallucinations, no delusions, no paranoia.  No signs of psychosis.  No plans to harm self or others.  Patient is not aggressive or threatening.  No psychomotor agitation.  No psychomotor retardation.  No abnormal involuntary movements. Thoughts are linear, logical, and goal directed.  Intellectual functioning is good.  Memory is intact to recent, remote, and past events.  Patient can recall 3 of 3 objects at 0 and 5 minutes, and what was eaten for last meal.  Patient is able to provide details of current situation.  Patient can name the president, vice president, and governor.  Language is good.  Vocabulary is unimpaired, no word finding difficulty or word misuse.  Intelligence is good, patient can interpret a proverb, and reports apple and orange similarity.  Calculation is unimpaired.  Concentration is good, able to recite days of week forward and backward.  Insight is good; patient is aware of their illness, how it affects their functioning, and what needs to happen for future improvement.  Judgment is good; patient is compliant with treatment and can relate appropriately to what they would do if smelling smoke in a theater or finding stamped addressed envelope.            Diagnosis:     Axis 1:1.  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  Generalized anxiety disorder, 3.  Insomnia 4.  Mood disorder unspecified     Axis 2: Deferred     Axis 3: Allergy to aspirin, codeine, propoxyphene, opiate agonists, Darvon.  History of COPD and uses O2, CVA, hyperlipidemia, hypertension, hypokalemia, hyponatremia, COVID-19.  Surgical history includes hysterectomy, cataract removal, C-section.  No history of head trauma, LOC or seizure disorder.    Allergies:  Darvon  Bactrim  Codine  Vistaril  Asprin    Medication  Requip 0.5mg  BID  Ativan 0.5mg  TID  Prozac 10 mg Daily  Trazodone 50 mg HS      Pharmacy:  [facility]      Objective :  BP (!) 158/98 (Site: Left, Patient  Position: Sitting)   Pulse 87   Resp 18   Ht 1.524 m (5')   Wt 56.2 kg (124 lb)   SpO2 98% Comment: with portable O2 @ 3L via NC  BMI 24.22 kg/m     Data reviewed:    Current Outpatient Medications   Medication Sig    albuterol sulfate (PROVENTIL OR VENTOLIN OR PROAIR) 90 mcg/actuation Inhalation oral inhaler Take 1-2 Puffs by inhalation Every 6 hours as needed    ascorbic acid, vitamin C, (VITAMIN C) 500 mg Oral Tablet Take 1 Tablet (500 mg total) by mouth Once a day    budesonide-glycopyr-formoterol (BREZTRI AEROSPHERE) 160-9-4.8 mcg/actuation Inhalation HFA Aerosol Inhaler Take 2 Puffs by inhalation Twice daily    clopidogreL (PLAVIX) 75 mg Oral Tablet Take 1 Tablet (75 mg total) by mouth Once a day    cyanocobalamin (VITAMIN B 12) 1,000 mcg Oral Tablet Take 1 Tablet (1,000 mcg total) by mouth Once a day    FLUoxetine (PROZAC) 10 mg Oral Capsule Take 1 Capsule (10 mg total) by mouth Once a day    hydroCHLOROthiazide (HYDRODIURIL) 25 mg Oral Tablet Take 1 Tablet (25 mg total) by mouth Once a day    IPRATROPIUM 0.5 MG-ALBUTEROL 3 MG (2.5 MG BASE)/3 ML NEBULIZATION SOLN Take 3 mL by nebulization Every 6 hours as needed for Wheezing    LORazepam (ATIVAN) 0.5 mg Oral Tablet Take 1 Tablet (0.5 mg total) by mouth Three times a day as needed for Anxiety    Potassium Gluconate 2.5 mEq Oral Tablet Take 1 Tablet (2.5 mEq total) by mouth Once a day    rOPINIRole (REQUIP) 0.5 mg Oral Tablet Take 1 Tablet (0.5 mg total) by mouth Twice daily    rosuvastatin (CRESTOR) 20 mg Oral Tablet Take 1 Tablet (20 mg total) by mouth Every evening    traZODone (DESYREL) 50 mg Oral Tablet Take 1 Tablet (50 mg total) by mouth Every night     Assessment/Plan  Moderate level of medical decision making, discussion of multiple diagnosis and symptoms, review of PHQ-9, review of symptoms, patient education, discussion of prescribed medications and potential side effects, discussion of psychosocial stressors, and review of prescription  monitoring program information.        Take medications as prescribed, avoid drugs and alcohol, call office if symptoms worsen or problems arise.  [phone].          Renae Fickle, PA-C

## 2022-05-03 ENCOUNTER — Encounter (HOSPITAL_PSYCHIATRIC): Payer: Self-pay | Admitting: PHYSICIAN ASSISTANT

## 2022-05-03 ENCOUNTER — Ambulatory Visit: Payer: Medicare Other | Attending: PHYSICIAN ASSISTANT | Admitting: PHYSICIAN ASSISTANT

## 2022-05-03 ENCOUNTER — Other Ambulatory Visit: Payer: Self-pay

## 2022-05-03 VITALS — BP 173/86 | HR 83 | Resp 20 | Ht 60.0 in | Wt 124.0 lb

## 2022-05-03 DIAGNOSIS — F410 Panic disorder [episodic paroxysmal anxiety] without agoraphobia: Secondary | ICD-10-CM

## 2022-05-03 DIAGNOSIS — F411 Generalized anxiety disorder: Secondary | ICD-10-CM | POA: Insufficient documentation

## 2022-05-03 DIAGNOSIS — F39 Unspecified mood [affective] disorder: Secondary | ICD-10-CM | POA: Insufficient documentation

## 2022-05-03 DIAGNOSIS — G4700 Insomnia, unspecified: Secondary | ICD-10-CM | POA: Insufficient documentation

## 2022-05-03 MED ORDER — FLUOXETINE 10 MG CAPSULE
10.0000 mg | ORAL_CAPSULE | Freq: Two times a day (BID) | ORAL | 3 refills | Status: DC
Start: 2022-05-03 — End: 2022-05-19

## 2022-05-03 MED ORDER — LORAZEPAM 1 MG TABLET
0.5000 mg | ORAL_TABLET | Freq: Two times a day (BID) | ORAL | 3 refills | Status: DC | PRN
Start: 2022-05-03 — End: 2022-06-20

## 2022-05-03 MED ORDER — ROPINIROLE 0.5 MG TABLET
0.5000 mg | ORAL_TABLET | Freq: Two times a day (BID) | ORAL | 3 refills | Status: AC
Start: 2022-05-03 — End: ?

## 2022-05-03 NOTE — Progress Notes (Signed)
[place], [facility] [place]  Operated by [facility]  Progress Note    Name: Michele Fitzgerald MRN:  [id number]   Date: 05/03/2022 Age: 82 y.o.       Chief Complaint: Major Depression, Generalized Anxiety, Panic Disorder, and Insomnia    Subjective:     HPI:  [name] is   here to follow-up for management of panic disorder and generalized anxiety.  Patient states he has been shaking pretty bad.  She has having a lot of anxiety today.  Tells me she has not been sleeping very well but has been taking Prozac in the evening and has been keeping her fairly calm and able to get better sleep at night.  She has requesting an increase in the Prozac which I do not have a problem doing.  I will increase her Ativan to 1 mg b.i.d. as well.  We will see her back in 3 months.  Sooner if needed.    PHQ-9 score today is 11    Review of Systems:     All systems reviewed & are unremarkable except as noted in HPI and below  Constitutional: Yes alert, Yes alert and oriented x4 and Yes appears well  HENT: Yes normal HENT inspection and Yes hearing grossly normal bilaterally  Respiratory: Yes normal respiratory effort, No no respiratory distress and Yes lungs clear  Cardiovascular:  No cardiac complaints, no chest pain  Gastrointestinal:  No GI complaints  Skin:  Warm,  dry, no rashes  Neurological:  No focal neurological deficits      MSE:  The patient is alert and oriented x4, casually dressed, good eye contact, well groomed, appearing stated age.  Speech is normal rate and tone.  Patient is talkative and personable. There is no flight of ideas, loosening of associations, or tangential speech.  Not manic.  Mood is euthymic with no complaints.  Affect congruent.  Patient does not appear to be in any acute physical distress.  No suicidal or homicidal ideation.  No auditory or visual hallucinations, no delusions, no paranoia.  No signs of psychosis.  No plans to harm  self or others.  Patient is not aggressive or threatening.  No psychomotor agitation.  No psychomotor retardation.  No abnormal involuntary movements. Thoughts are linear, logical, and goal directed.  Intellectual functioning is good.  Memory is intact to recent, remote, and past events.  Patient can recall 3 of 3 objects at 0 and 5 minutes, and what was eaten for last meal.  Patient is able to provide details of current situation.  Patient can name the president, vice president, and governor.  Language is good.  Vocabulary is unimpaired, no word finding difficulty or word misuse.  Intelligence is good, patient can interpret a proverb, and reports apple and orange similarity.  Calculation is unimpaired.  Concentration is good, able to recite days of week forward and backward.  Insight is good; patient is aware of their illness, how it affects their functioning, and what needs to happen for future improvement.  Judgment is good; patient is compliant with treatment and can relate appropriately to what they would do if smelling smoke in a theater or finding stamped addressed envelope.            Diagnosis:     Axis 1:1.  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  Generalized anxiety disorder, 3.  Insomnia 4.  Mood disorder unspecified     Axis  2: Deferred     Axis 3: Allergy to aspirin, codeine, propoxyphene, opiate agonists, Darvon.  History of COPD and uses O2, CVA, hyperlipidemia, hypertension, hypokalemia, hyponatremia, COVID-19.  Surgical history includes hysterectomy, cataract removal, C-section.  No history of head trauma, LOC or seizure disorder.    Allergies:  Darvon  Bactrim  Codine  Vistaril  Asprin    Medication  Requip 0.5mg  BID  Ativan 1 mg b.i.d.   Prozac 10 mg b.i.d.        Pharmacy:  [facility]      Objective :  BP (!) 173/86 (Site: Left, Patient Position: Sitting)   Pulse 83   Resp 20   Ht 1.524 m (5')   Wt 56.2 kg (124 lb)   BMI 24.22 kg/m     Data reviewed:    Current Outpatient  Medications   Medication Sig    albuterol sulfate (PROVENTIL OR VENTOLIN OR PROAIR) 90 mcg/actuation Inhalation oral inhaler Take 1-2 Puffs by inhalation Every 6 hours as needed    ascorbic acid, vitamin C, (VITAMIN C) 500 mg Oral Tablet Take 1 Tablet (500 mg total) by mouth Once a day    budesonide-glycopyr-formoterol (BREZTRI AEROSPHERE) 160-9-4.8 mcg/actuation Inhalation HFA Aerosol Inhaler Take 2 Puffs by inhalation Twice daily    clopidogreL (PLAVIX) 75 mg Oral Tablet Take 1 Tablet (75 mg total) by mouth Once a day    cyanocobalamin (VITAMIN B 12) 1,000 mcg Oral Tablet Take 1 Tablet (1,000 mcg total) by mouth Once a day    FLUoxetine (PROZAC) 10 mg Oral Capsule Take 1 Capsule (10 mg total) by mouth Twice daily    hydroCHLOROthiazide (HYDRODIURIL) 25 mg Oral Tablet Take 1 Tablet (25 mg total) by mouth Once a day    IPRATROPIUM 0.5 MG-ALBUTEROL 3 MG (2.5 MG BASE)/3 ML NEBULIZATION SOLN Take 3 mL by nebulization Every 6 hours as needed for Wheezing    LORazepam (ATIVAN) 1 mg Oral Tablet Take 0.5 Tablets (0.5 mg total) by mouth Twice per day as needed for Anxiety    Potassium Gluconate 2.5 mEq Oral Tablet Take 1 Tablet (2.5 mEq total) by mouth Once a day    rOPINIRole (REQUIP) 0.5 mg Oral Tablet Take 1 Tablet (0.5 mg total) by mouth Twice daily    rosuvastatin (CRESTOR) 20 mg Oral Tablet Take 1 Tablet (20 mg total) by mouth Every evening     Assessment/Plan  Moderate level of medical decision making, discussion of multiple diagnosis and symptoms, review of PHQ-9, review of symptoms, patient education, discussion of prescribed medications and potential side effects, discussion of psychosocial stressors, and review of prescription monitoring program information.    Increase Ativan 1 mg b.i.d., increase Prozac to 10 mg b.i.d., discontinue trazodone    Take medications as prescribed, avoid drugs and alcohol, call office if symptoms worsen or problems arise.  [phone].          Renae Fickle, PA-C

## 2022-05-19 ENCOUNTER — Telehealth (HOSPITAL_PSYCHIATRIC): Payer: Self-pay | Admitting: PHYSICIAN ASSISTANT

## 2022-05-19 NOTE — Telephone Encounter (Signed)
Ted, Insurance does not want to pay for fluoxetine 10 mg bid. Can we change to fluoxetine 20 mg daily? I have built the prescription, so if you are ok with this,  please, sign and send in the prescription for the fluoxetine 20 mg.    Thank you, Juliann Pulse

## 2022-05-20 MED ORDER — FLUOXETINE 20 MG CAPSULE
20.0000 mg | ORAL_CAPSULE | Freq: Every day | ORAL | 2 refills | Status: AC
Start: 2022-05-20 — End: ?

## 2022-06-20 ENCOUNTER — Telehealth (HOSPITAL_PSYCHIATRIC): Payer: Self-pay | Admitting: PHYSICIAN ASSISTANT

## 2022-06-20 NOTE — Telephone Encounter (Signed)
I had to correct the Lorazepam prescription to say one tablet twice a day. The old prescription still said one -half tablet twice a day. I have built the corrected prescription if you don't mind to approve or deny. Thanks!

## 2022-06-21 ENCOUNTER — Other Ambulatory Visit (HOSPITAL_PSYCHIATRIC): Payer: Self-pay | Admitting: PHYSICIAN ASSISTANT

## 2022-06-21 MED ORDER — LORAZEPAM 1 MG TABLET
1.0000 mg | ORAL_TABLET | Freq: Two times a day (BID) | ORAL | 3 refills | Status: DC | PRN
Start: 2022-06-21 — End: 2022-10-06

## 2022-06-21 MED ORDER — LORAZEPAM 1 MG TABLET
1.0000 mg | ORAL_TABLET | Freq: Two times a day (BID) | ORAL | 3 refills | Status: DC | PRN
Start: 2022-06-21 — End: 2022-06-21

## 2022-07-27 ENCOUNTER — Other Ambulatory Visit (HOSPITAL_COMMUNITY): Payer: Self-pay | Admitting: Pulmonary Disease

## 2022-07-27 ENCOUNTER — Other Ambulatory Visit: Payer: Self-pay

## 2022-07-27 ENCOUNTER — Inpatient Hospital Stay
Admission: RE | Admit: 2022-07-27 | Discharge: 2022-07-27 | Disposition: A | Discharge status: Home / Self Care | Payer: Medicare Other | Source: Ambulatory Visit | Attending: Pulmonary Disease | Admitting: Pulmonary Disease

## 2022-07-27 DIAGNOSIS — R0609 Other forms of dyspnea: Secondary | ICD-10-CM | POA: Insufficient documentation

## 2022-09-29 ENCOUNTER — Emergency Department (HOSPITAL_COMMUNITY): Payer: Medicare Other

## 2022-09-29 ENCOUNTER — Other Ambulatory Visit: Payer: Self-pay

## 2022-09-29 ENCOUNTER — Encounter (HOSPITAL_COMMUNITY): Payer: Self-pay

## 2022-09-29 ENCOUNTER — Inpatient Hospital Stay
Admission: EM | Admit: 2022-09-29 | Discharge: 2022-10-06 | DRG: 871 | Disposition: A | Discharge status: Home Health | Payer: Medicare Other | Attending: Internal Medicine | Admitting: Internal Medicine

## 2022-09-29 ENCOUNTER — Inpatient Hospital Stay (HOSPITAL_COMMUNITY): Payer: Medicare Other | Admitting: Internal Medicine

## 2022-09-29 DIAGNOSIS — J441 Chronic obstructive pulmonary disease with (acute) exacerbation: Secondary | ICD-10-CM | POA: Diagnosis present

## 2022-09-29 DIAGNOSIS — T424X5A Adverse effect of benzodiazepines, initial encounter: Secondary | ICD-10-CM | POA: Diagnosis not present

## 2022-09-29 DIAGNOSIS — Z7982 Long term (current) use of aspirin: Secondary | ICD-10-CM

## 2022-09-29 DIAGNOSIS — F411 Generalized anxiety disorder: Secondary | ICD-10-CM | POA: Diagnosis present

## 2022-09-29 DIAGNOSIS — Z66 Do not resuscitate: Secondary | ICD-10-CM | POA: Diagnosis present

## 2022-09-29 DIAGNOSIS — T4275XA Adverse effect of unspecified antiepileptic and sedative-hypnotic drugs, initial encounter: Secondary | ICD-10-CM | POA: Diagnosis present

## 2022-09-29 DIAGNOSIS — I252 Old myocardial infarction: Secondary | ICD-10-CM

## 2022-09-29 DIAGNOSIS — I129 Hypertensive chronic kidney disease with stage 1 through stage 4 chronic kidney disease, or unspecified chronic kidney disease: Secondary | ICD-10-CM | POA: Diagnosis present

## 2022-09-29 DIAGNOSIS — J9621 Acute and chronic respiratory failure with hypoxia: Secondary | ICD-10-CM | POA: Diagnosis present

## 2022-09-29 DIAGNOSIS — N182 Chronic kidney disease, stage 2 (mild): Secondary | ICD-10-CM | POA: Diagnosis present

## 2022-09-29 DIAGNOSIS — G9341 Metabolic encephalopathy: Secondary | ICD-10-CM | POA: Diagnosis present

## 2022-09-29 DIAGNOSIS — F17200 Nicotine dependence, unspecified, uncomplicated: Secondary | ICD-10-CM | POA: Insufficient documentation

## 2022-09-29 DIAGNOSIS — R079 Chest pain, unspecified: Secondary | ICD-10-CM

## 2022-09-29 DIAGNOSIS — F17210 Nicotine dependence, cigarettes, uncomplicated: Secondary | ICD-10-CM | POA: Diagnosis present

## 2022-09-29 DIAGNOSIS — I21A1 Myocardial infarction type 2: Secondary | ICD-10-CM

## 2022-09-29 DIAGNOSIS — A408 Other streptococcal sepsis: Principal | ICD-10-CM | POA: Diagnosis present

## 2022-09-29 DIAGNOSIS — J440 Chronic obstructive pulmonary disease with acute lower respiratory infection: Secondary | ICD-10-CM | POA: Diagnosis present

## 2022-09-29 DIAGNOSIS — Z515 Encounter for palliative care: Secondary | ICD-10-CM

## 2022-09-29 DIAGNOSIS — R6520 Severe sepsis without septic shock: Secondary | ICD-10-CM | POA: Diagnosis present

## 2022-09-29 DIAGNOSIS — J9622 Acute and chronic respiratory failure with hypercapnia: Secondary | ICD-10-CM | POA: Diagnosis present

## 2022-09-29 DIAGNOSIS — I249 Acute ischemic heart disease, unspecified: Secondary | ICD-10-CM | POA: Diagnosis present

## 2022-09-29 DIAGNOSIS — J9612 Chronic respiratory failure with hypercapnia: Secondary | ICD-10-CM | POA: Diagnosis present

## 2022-09-29 DIAGNOSIS — Z9981 Dependence on supplemental oxygen: Secondary | ICD-10-CM

## 2022-09-29 DIAGNOSIS — I2510 Atherosclerotic heart disease of native coronary artery without angina pectoris: Secondary | ICD-10-CM | POA: Diagnosis present

## 2022-09-29 DIAGNOSIS — G928 Other toxic encephalopathy: Secondary | ICD-10-CM | POA: Diagnosis present

## 2022-09-29 DIAGNOSIS — J189 Pneumonia, unspecified organism: Secondary | ICD-10-CM | POA: Diagnosis present

## 2022-09-29 DIAGNOSIS — G9340 Encephalopathy, unspecified: Secondary | ICD-10-CM | POA: Diagnosis present

## 2022-09-29 DIAGNOSIS — J9620 Acute and chronic respiratory failure, unspecified whether with hypoxia or hypercapnia: Secondary | ICD-10-CM

## 2022-09-29 DIAGNOSIS — E8729 Other acidosis: Secondary | ICD-10-CM | POA: Diagnosis present

## 2022-09-29 DIAGNOSIS — R0689 Other abnormalities of breathing: Secondary | ICD-10-CM

## 2022-09-29 DIAGNOSIS — Z8673 Personal history of transient ischemic attack (TIA), and cerebral infarction without residual deficits: Secondary | ICD-10-CM

## 2022-09-29 DIAGNOSIS — J9691 Respiratory failure, unspecified with hypoxia: Secondary | ICD-10-CM | POA: Diagnosis present

## 2022-09-29 DIAGNOSIS — J13 Pneumonia due to Streptococcus pneumoniae: Secondary | ICD-10-CM | POA: Diagnosis present

## 2022-09-29 DIAGNOSIS — J811 Chronic pulmonary edema: Secondary | ICD-10-CM | POA: Diagnosis present

## 2022-09-29 HISTORY — DX: Essential (primary) hypertension: I10

## 2022-09-29 HISTORY — DX: Old myocardial infarction: I25.2

## 2022-09-29 HISTORY — DX: Cerebral infarction, unspecified (CMS HCC): I63.9

## 2022-09-29 HISTORY — DX: Chronic obstructive pulmonary disease, unspecified (CMS HCC): J44.9

## 2022-09-29 LAB — PTT (PARTIAL THROMBOPLASTIN TIME): APTT: 31.5 seconds (ref 25.0–38.0)

## 2022-09-29 LAB — ARTERIAL BLOOD GAS/LACTATE
%FIO2 (ARTERIAL): 21 %
ALLEN TEST: POSITIVE
CARBOXYHEMOGLOBIN: 3.7 % — ABNORMAL HIGH (ref <=1.5)
MET-HEMOGLOBIN: 0.7 % (ref <=2.0)
O2CT: 14.6 %
OXYHEMOGLOBIN: 78.1 % — CL (ref 88.0–100.0)
PH (ARTERIAL): 6.98 — CL (ref 7.35–7.45)
PO2 (ARTERIAL): 64 mm/Hg — ABNORMAL LOW (ref 80–100)

## 2022-09-29 LAB — COMPREHENSIVE METABOLIC PANEL, NON-FASTING
ALBUMIN/GLOBULIN RATIO: 1.7 — ABNORMAL HIGH (ref 0.8–1.4)
ALBUMIN: 4.4 g/dL (ref 3.5–5.7)
ALKALINE PHOSPHATASE: 86 U/L (ref 34–104)
ALT (SGPT): 21 U/L (ref 7–52)
ANION GAP: 6 mmol/L (ref 4–13)
AST (SGOT): 32 U/L (ref 13–39)
BILIRUBIN TOTAL: 0.7 mg/dL (ref 0.3–1.2)
BUN/CREA RATIO: 14 (ref 6–22)
BUN: 13 mg/dL (ref 7–25)
CALCIUM, CORRECTED: 8.6 mg/dL — ABNORMAL LOW (ref 8.9–10.8)
CALCIUM: 8.9 mg/dL (ref 8.6–10.3)
CHLORIDE: 95 mmol/L — ABNORMAL LOW (ref 98–107)
CO2 TOTAL: 35 mmol/L — ABNORMAL HIGH (ref 21–31)
CREATININE: 0.92 mg/dL (ref 0.60–1.30)
ESTIMATED GFR: 63 mL/min/{1.73_m2} (ref >59)
GLOBULIN: 2.6 — ABNORMAL LOW (ref 2.9–5.4)
GLUCOSE: 314 mg/dL — ABNORMAL HIGH (ref 74–109)
OSMOLALITY, CALCULATED: 284 mOsm/kg (ref 270–290)
POTASSIUM: 4.6 mmol/L (ref 3.5–5.1)
PROTEIN TOTAL: 7 g/dL (ref 6.4–8.9)
SODIUM: 136 mmol/L (ref 136–145)

## 2022-09-29 LAB — CBC WITH DIFF
BASOPHIL #: 0.2 10*3/uL — ABNORMAL HIGH (ref 0.00–0.10)
BASOPHIL %: 1 % (ref 0–1)
EOSINOPHIL #: 0.2 10*3/uL (ref 0.00–0.50)
EOSINOPHIL %: 1 %
HCT: 40.2 % (ref 31.2–41.9)
HGB: 12.7 g/dL (ref 10.9–14.3)
LYMPHOCYTE #: 3.5 10*3/uL — ABNORMAL HIGH (ref 1.00–3.00)
LYMPHOCYTE %: 16 % (ref 16–44)
MCH: 29 pg (ref 24.7–32.8)
MCHC: 31.7 g/dL — ABNORMAL LOW (ref 32.3–35.6)
MCV: 91.5 fL (ref 75.5–95.3)
MONOCYTE #: 1 10*3/uL (ref 0.30–1.00)
MONOCYTE %: 5 % (ref 5–13)
MPV: 6.4 fL — ABNORMAL LOW (ref 7.9–10.8)
NEUTROPHIL #: 16.6 10*3/uL — ABNORMAL HIGH (ref 1.85–7.80)
NEUTROPHIL %: 77 % (ref 43–77)
PLATELETS: 330 10*3/uL (ref 140–440)
RBC: 4.39 10*6/uL (ref 3.63–4.92)
RDW: 14.1 % (ref 12.3–17.7)
WBC: 21.6 10*3/uL — ABNORMAL HIGH (ref 3.8–11.8)

## 2022-09-29 LAB — C-REACTIVE PROTEIN (CRP): C-REACTIVE PROTEIN (CRP): 3.6 mg/dL — ABNORMAL HIGH (ref 0.1–0.5)

## 2022-09-29 LAB — PT/INR
INR: 0.97 (ref 0.84–1.10)
PROTHROMBIN TIME: 11.3 seconds (ref 9.8–12.7)

## 2022-09-29 LAB — LACTIC ACID LEVEL W/ REFLEX FOR LEVEL >2.0: LACTIC ACID: 2.2 mmol/L (ref 0.5–2.2)

## 2022-09-29 LAB — COVID-19, FLU A/B, RSV RAPID BY PCR
INFLUENZA VIRUS TYPE A: NOT DETECTED
INFLUENZA VIRUS TYPE B: NOT DETECTED
RESPIRATORY SYNCTIAL VIRUS (RSV): NOT DETECTED
SARS-CoV-2: NOT DETECTED

## 2022-09-29 LAB — B-TYPE NATRIURETIC PEPTIDE (BNP),PLASMA: BNP: 212 pg/mL — ABNORMAL HIGH (ref 5–100)

## 2022-09-29 LAB — LACTIC ACID - FIRST REFLEX: LACTIC ACID: 1.1 mmol/L (ref 0.5–2.2)

## 2022-09-29 LAB — TROPONIN-I: TROPONIN I: 43 ng/L — ABNORMAL HIGH (ref <15)

## 2022-09-29 MED ORDER — IPRATROPIUM 0.5 MG-ALBUTEROL 3 MG (2.5 MG BASE)/3 ML NEBULIZATION SOLN
3.0000 mL | INHALATION_SOLUTION | RESPIRATORY_TRACT | Status: DC | PRN
Start: 2022-09-29 — End: 2022-10-06

## 2022-09-29 MED ORDER — IPRATROPIUM 0.5 MG-ALBUTEROL 3 MG (2.5 MG BASE)/3 ML NEBULIZATION SOLN
3.0000 mL | INHALATION_SOLUTION | RESPIRATORY_TRACT | Status: DC
Start: 2022-09-29 — End: 2022-10-06
  Administered 2022-09-29: 0 mL via RESPIRATORY_TRACT

## 2022-09-29 MED ORDER — MORPHINE 2 MG/ML INJECTION WRAPPER
2.0000 mg | INJECTION | INTRAMUSCULAR | Status: DC | PRN
Start: 2022-09-29 — End: 2022-10-04
  Administered 2022-09-30 – 2022-10-04 (×2): 2 mg via INTRAVENOUS
  Filled 2022-09-29 (×2): qty 1

## 2022-09-29 MED ORDER — SODIUM BICARBONATE 8.4 % (1 MEQ/ML) INTRAVENOUS SYRINGE
100.0000 meq | INJECTION | INTRAVENOUS | Status: AC
Start: 2022-09-29 — End: 2022-09-29
  Administered 2022-09-29: 100 meq via INTRAVENOUS

## 2022-09-29 MED ORDER — SODIUM CHLORIDE 0.9 % INTRAVENOUS PIGGYBACK
1.0000 g | INTRAVENOUS | Status: DC
Start: 2022-09-30 — End: 2022-10-06
  Administered 2022-09-30: 1 g via INTRAVENOUS
  Administered 2022-09-30 – 2022-10-01 (×2): 0 g via INTRAVENOUS
  Administered 2022-10-01 – 2022-10-02 (×2): 1 g via INTRAVENOUS
  Administered 2022-10-02: 0 g via INTRAVENOUS
  Administered 2022-10-03: 1 g via INTRAVENOUS
  Administered 2022-10-03: 0 g via INTRAVENOUS
  Administered 2022-10-04: 1 g via INTRAVENOUS
  Administered 2022-10-04 – 2022-10-05 (×2): 0 g via INTRAVENOUS
  Administered 2022-10-05: 1 g via INTRAVENOUS
  Filled 2022-09-29 (×6): qty 10

## 2022-09-29 MED ORDER — SODIUM CHLORIDE 0.9 % INTRAVENOUS SOLUTION
INTRAVENOUS | Status: DC
Start: 2022-09-30 — End: 2022-09-30
  Administered 2022-09-30: 0 mL via INTRAVENOUS

## 2022-09-29 MED ORDER — SODIUM CHLORIDE 0.9 % INTRAVENOUS PIGGYBACK
2.0000 g | INTRAVENOUS | Status: AC
Start: 2022-09-29 — End: 2022-09-29
  Administered 2022-09-29: 2 g via INTRAVENOUS
  Administered 2022-09-29: 0 g via INTRAVENOUS

## 2022-09-29 MED ORDER — SODIUM BICARBONATE 8.4 % (1 MEQ/ML) INTRAVENOUS SYRINGE
INJECTION | INTRAVENOUS | Status: AC
Start: 2022-09-29 — End: 2022-09-29
  Filled 2022-09-29: qty 100

## 2022-09-29 MED ORDER — FUROSEMIDE 10 MG/ML INJECTION SOLUTION
INTRAMUSCULAR | Status: AC
Start: 2022-09-29 — End: 2022-09-29
  Filled 2022-09-29: qty 4

## 2022-09-29 MED ORDER — MORPHINE 2 MG/ML INJECTION WRAPPER
INJECTION | INTRAMUSCULAR | Status: AC
Start: 2022-09-29 — End: 2022-09-29
  Filled 2022-09-29: qty 1

## 2022-09-29 MED ORDER — METHYLPREDNISOLONE SOD SUCC 125 MG SOLUTION FOR INJECTION WRAPPER
INTRAVENOUS | Status: AC
Start: 2022-09-29 — End: 2022-09-29
  Filled 2022-09-29: qty 2

## 2022-09-29 MED ORDER — FUROSEMIDE 10 MG/ML INJECTION SOLUTION
40.0000 mg | Freq: Two times a day (BID) | INTRAMUSCULAR | Status: DC
Start: 2022-09-30 — End: 2022-10-06
  Administered 2022-09-30 – 2022-10-05 (×11): 40 mg via INTRAVENOUS
  Administered 2022-10-05 – 2022-10-06 (×3): 0 mg via INTRAVENOUS
  Filled 2022-09-29 (×14): qty 4

## 2022-09-29 MED ORDER — SODIUM CHLORIDE 0.9 % INTRAVENOUS PIGGYBACK
INJECTION | INTRAVENOUS | Status: AC
Start: 2022-09-29 — End: 2022-09-29
  Filled 2022-09-29: qty 100

## 2022-09-29 MED ORDER — DOXYCYCLINE HYCLATE 100 MG INTRAVENOUS POWDER FOR SOLUTION
INTRAVENOUS | Status: AC
Start: 2022-09-29 — End: 2022-09-29
  Filled 2022-09-29: qty 10

## 2022-09-29 MED ORDER — MORPHINE 2 MG/ML INJECTION WRAPPER
2.0000 mg | INJECTION | INTRAMUSCULAR | Status: AC
Start: 2022-09-29 — End: 2022-09-29
  Administered 2022-09-29: 2 mg via INTRAVENOUS

## 2022-09-29 MED ORDER — FUROSEMIDE 10 MG/ML INJECTION SOLUTION
40.0000 mg | INTRAMUSCULAR | Status: AC
Start: 2022-09-29 — End: 2022-09-29
  Administered 2022-09-29: 40 mg via INTRAVENOUS

## 2022-09-29 MED ORDER — METHYLPREDNISOLONE SOD SUCCINATE 40 MG/ML SOLUTION FOR INJ. WRAPPER
80.0000 mg | Freq: Four times a day (QID) | INTRAMUSCULAR | Status: DC
Start: 2022-09-30 — End: 2022-09-30
  Administered 2022-09-30: 80 mg via INTRAVENOUS
  Filled 2022-09-29: qty 2

## 2022-09-29 MED ORDER — DEXTROSE 5 % IN WATER (D5W) INTRAVENOUS SOLUTION
INTRAVENOUS | Status: DC
Start: 2022-09-29 — End: 2022-09-30
  Filled 2022-09-29 (×4): qty 150

## 2022-09-29 MED ORDER — SODIUM CHLORIDE 0.9 % INTRAVENOUS PIGGYBACK
INJECTION | INTRAVENOUS | Status: AC
Start: 2022-09-29 — End: 2022-09-29
  Filled 2022-09-29: qty 50

## 2022-09-29 MED ORDER — SODIUM CHLORIDE 0.9 % INTRAVENOUS PIGGYBACK
100.0000 mg | Freq: Two times a day (BID) | INTRAVENOUS | Status: DC
Start: 2022-09-30 — End: 2022-10-06
  Administered 2022-09-29: 100 mg via INTRAVENOUS
  Administered 2022-09-29 – 2022-09-30 (×2): 0 mg via INTRAVENOUS
  Administered 2022-09-30 – 2022-10-01 (×3): 100 mg via INTRAVENOUS
  Administered 2022-10-01 (×2): 0 mg via INTRAVENOUS
  Administered 2022-10-01: 100 mg via INTRAVENOUS
  Administered 2022-10-02 (×2): 0 mg via INTRAVENOUS
  Administered 2022-10-02: 100 mg via INTRAVENOUS
  Administered 2022-10-03: 0 mg via INTRAVENOUS
  Administered 2022-10-03: 100 mg via INTRAVENOUS
  Administered 2022-10-03: 0 mg via INTRAVENOUS
  Administered 2022-10-03 – 2022-10-04 (×2): 100 mg via INTRAVENOUS
  Administered 2022-10-04: 0 mg via INTRAVENOUS
  Administered 2022-10-04: 100 mg via INTRAVENOUS
  Administered 2022-10-04: 0 mg via INTRAVENOUS
  Administered 2022-10-04: 100 mg via INTRAVENOUS
  Administered 2022-10-05 (×2): 0 mg via INTRAVENOUS
  Administered 2022-10-05 – 2022-10-06 (×3): 100 mg via INTRAVENOUS
  Administered 2022-10-06 (×2): 0 mg via INTRAVENOUS
  Filled 2022-09-29 (×13): qty 10

## 2022-09-29 MED ORDER — METHYLPREDNISOLONE SOD SUCC 125 MG SOLUTION FOR INJECTION WRAPPER
125.0000 mg | INTRAVENOUS | Status: AC
Start: 2022-09-29 — End: 2022-09-29
  Administered 2022-09-29: 125 mg via INTRAVENOUS

## 2022-09-29 MED ORDER — CEFTRIAXONE 2 GRAM SOLUTION FOR INJECTION
INTRAMUSCULAR | Status: AC
Start: 2022-09-29 — End: 2022-09-29
  Filled 2022-09-29: qty 20

## 2022-09-29 NOTE — ED Provider Notes (Signed)
[facility]  ED Primary Provider Note  History of Present Illness   Chief Complaint   Patient presents with    Shortness of Breath    Altered Mental Status     Michele Fitzgerald is a 82 y.o. female who had concerns including Shortness of Breath and Altered Mental Status.  Arrival: The patient arrived by Ambulance    Patient 82 year old female past medical history of chronic obstructive pulmonary disease tobacco use presents emergency department via EMS he was complaints of shortness of breath.  Immediately after patient enter the ambulance base she became largely unresponsive.  She was tachycardic and hypoxic.  She received a breathing treatment EN route EMS feels like she got worse halfway through that.  Patient was reportedly DNR.  Son showed up shortly after arrival who confirmed the patient was DNR and did present paperwork.  He was noted that the patient likely need intubated prior to arrival.  In son states that the patient was adamantly against going on a respirator.  Dr.Petrarca was present and had conversations with the family.  It was elected to treat the patient conservatively without intubation.  There was a question of possible ischemic or hemorrhagic stroke but patient was unable to go to CT without being intubated.  This was discussed with the family it was elected to forego CT.      History Reviewed This Encounter:      Physical Exam   ED Triage Vitals   BP (Non-Invasive) 09/29/22 2048 (!) 197/86   Heart Rate 09/29/22 2048 (!) 138   Respiratory Rate 09/29/22 2048 (!) 34   Temperature 09/29/22 2048 36.4 C (97.6 F)   SpO2 09/29/22 2048 93 %   Weight 09/30/22 0131 63.3 kg (139 lb 9.6 oz)   Height 09/30/22 0131 1.524 m (5')     Physical Exam  Constitutional:       General: She is in acute distress.      Appearance: She is ill-appearing and diaphoretic.   HENT:      Head: Atraumatic.      Nose: Nose normal.      Mouth/Throat:      Mouth: Mucous membranes are moist.    Eyes:      Extraocular Movements: Extraocular movements intact.      Pupils: Pupils are equal, round, and reactive to light.   Cardiovascular:      Rate and Rhythm: Normal rate and regular rhythm.      Pulses: Normal pulses.      Heart sounds: Normal heart sounds.   Pulmonary:      Effort: Respiratory distress present.      Breath sounds: Wheezing present.   Abdominal:      General: Abdomen is flat.      Palpations: Abdomen is soft.   Musculoskeletal:      Cervical back: Normal range of motion.   Skin:     Comments: Mottling present   Neurological:      Mental Status: She is alert.      Comments: Patient will not respond does not make eye contact.  Does not follow commands.  Gag intact.       Patient Data     Labs Ordered/Reviewed   COMPREHENSIVE METABOLIC PANEL, NON-FASTING - Abnormal; Notable for the following components:       Result Value    CHLORIDE 95 (*)     CO2 TOTAL 35 (*)     GLUCOSE 314 (*)  ALBUMIN/GLOBULIN RATIO 1.7 (*)     CALCIUM, CORRECTED 8.6 (*)     GLOBULIN 2.6 (*)     All other components within normal limits    Narrative:     Estimated Glomerular Filtration Rate (eGFR) is calculated using the CKD-EPI (2021) equation, intended for patients 69 years of age and older. If gender is not documented or "unknown", there will be no eGFR calculation.     C-REACTIVE PROTEIN (CRP) - Abnormal; Notable for the following components:    C-REACTIVE PROTEIN (CRP) 3.6 (*)     All other components within normal limits   B-TYPE NATRIURETIC PEPTIDE (BNP),PLASMA - Abnormal; Notable for the following components:    BNP 212 (*)     All other components within normal limits    Narrative:                                 Class 1: 101-250 pg/mL                              Class 2: 251-550 pg/mL                              Class 3: 551-900 pg/mL                              Class 4: >901 pg/mL     The [place] Heart Association has developed a four-stage functional classification system for CHF that is based on a  subjective interpretation of the severity of a patient's clinical signs and symptoms.    Class 1 - Patients have no limitations on physical activity and have no symptoms with ordinary physical activity.    Class 2 - Patients have a slight limitation of physical activity and have symptoms with ordinary physical activity.    Class 3 - Patients have a marked limitation of physical activity and have symptoms with less than ordinary physical activity, but not at rest.    Class 4 - Patients are unable to perform any physical activity without discomfort.   ARTERIAL BLOOD GAS/LACTATE - Abnormal; Notable for the following components:    PH (ARTERIAL) 6.98 (*)     CARBOXYHEMOGLOBIN 3.7 (*)     PO2 (ARTERIAL) 64 (*)     OXYHEMOGLOBIN 78.1 (*)     All other components within normal limits   CBC WITH DIFF - Abnormal; Notable for the following components:    WBC 21.6 (*)     MCHC 31.7 (*)     MPV 6.4 (*)     NEUTROPHIL # 16.60 (*)     LYMPHOCYTE # 3.50 (*)     BASOPHIL # 0.20 (*)     All other components within normal limits   TROPONIN-I - Abnormal; Notable for the following components:    TROPONIN I 43 (*)     All other components within normal limits   LACTIC ACID LEVEL W/ REFLEX FOR LEVEL >2.0 - Normal   COVID-19, FLU A/B, RSV RAPID BY PCR - Normal    Narrative:     Results are for the simultaneous qualitative identification of SARS-CoV-2 (formerly 2019-nCoV), Influenza A, Influenza B, and RSV RNA. These etiologic agents are generally detectable in nasopharyngeal and nasal swabs during the ACUTE PHASE  of infection. Hence, this test is intended to be performed on respiratory specimens collected from individuals with signs and symptoms of upper respiratory tract infection who meet Centers for Disease Control and Prevention (CDC) clinical and/or epidemiological criteria for Coronavirus Disease 2019 (COVID-19) testing. CDC COVID-19 criteria for testing on human specimens is available at [facility] webpage information for Healthcare  Professionals: Coronavirus Disease 2019 (COVID-19) ([email or web address]).     False-negative results may occur if the virus has genomic mutations, insertions, deletions, or rearrangements or if performed very early in the course of illness. Otherwise, negative results indicate virus specific RNA targets are not detected, however negative results do not preclude SARS-CoV-2 infection/COVID-19, Influenza, or Respiratory syncytial virus infection. Results should not be used as the sole basis for patient management decisions. Negative results must be combined with clinical observations, patient history, and epidemiological information. If upper respiratory tract infection is still suspected based on exposure history together with other clinical findings, re-testing should be considered.    Disclaimer:   This assay has been authorized by FDA under an Emergency Use Authorization for use in laboratories certified under the Clinical Laboratory Improvement Amendments of 1988 (CLIA), 42 U.S.C. 5850140852, to perform high complexity tests. The impacts of vaccines, antiviral therapeutics, antibiotics, chemotherapeutic or immunosuppressant drugs have not been evaluated.     Test methodology:   Cepheid Xpert Xpress SARS-CoV-2/Flu/RSV Assay real-time polymerase chain reaction (RT-PCR) test on the GeneXpert Dx and Xpert Xpress systems.   PT/INR - Normal    Narrative:     INR OF 2.0-3.0  RECOMMENDED FOR: PROPHYLAXIS/TREATMENT OF VENEOUS THROMBOSIS, PULMONARY EMBOLISM, PREVENTION OF SYSTEMIC EMBOLISM FROM ATRIAL FIBRILATION, MYOCARDIAL INFARCTION.    INR OF 2.5-3.5  RECOMMENDED FOR MECHANICAL PROSTHETIC HEART VALVES, RECURRENT SYSTEMIC EMBOLISM, RECURRENT MYOCARDIAL INFARCTION.     PTT (PARTIAL THROMBOPLASTIN TIME) - Normal   LACTIC ACID - FIRST REFLEX - Normal   CBC/DIFF    Narrative:     The following orders were created for panel order CBC/DIFF.  Procedure                               Abnormality          Status                     ---------                               -----------         ------                     CBC WITH ELFY[[id number]]                Abnormal            Final result                 Please view results for these tests on the individual orders.     XR AP MOBILE CHEST   Final Result by Edi, Radresults In (07/04 2125)   1. SEPTAL THICKENING SUGGESTING INTERSTITIAL EDEMA   2. FOCAL RIGHT UPPER LOBE OPACITY. SUPERIMPOSED INFECTION IS NOT EXCLUDED.            Radiologist location ID: [id number]           Medical Decision Making        Medical Decision Making  CBC demonstrated leukocytosis white blood count 21000 patient had been receiving prednisone metabolic panel unremarkable ABG demonstrates significant acidosis pH of 6.99 with unreadable pCO2.  Chest x-ray demonstrates right-sided pulmonary edema and possible pneumonia.  Lactate 2.2.  EKG demonstrates sinus tachycardia initial troponin elevated at 42 BNP 240.  Patient received Solu-Medrol ceftriaxone Lasix had external catheter placed.  Discussed case with the hospital team agreed evaluate patient for admission    Amount and/or Complexity of Data Reviewed  Labs: ordered.  Radiology: ordered.  ECG/medicine tests: ordered.    Risk  Prescription drug management.  Parenteral controlled substances.  Decision regarding hospitalization.                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

## 2022-09-29 NOTE — ED Triage Notes (Addendum)
Brought to ED via [facility] with c/o SOB, intermittent x1 day, worse this evening. Home 02 use of 3L/min without relief. New onset confusion.    PTA via EMS: 20g IV left AC, breathing treatment, 02 @ 4L/min

## 2022-09-30 ENCOUNTER — Inpatient Hospital Stay (HOSPITAL_COMMUNITY): Payer: Medicare Other

## 2022-09-30 ENCOUNTER — Encounter (HOSPITAL_COMMUNITY): Payer: Self-pay | Admitting: Internal Medicine

## 2022-09-30 DIAGNOSIS — J441 Chronic obstructive pulmonary disease with (acute) exacerbation: Secondary | ICD-10-CM

## 2022-09-30 DIAGNOSIS — R6520 Severe sepsis without septic shock: Secondary | ICD-10-CM

## 2022-09-30 DIAGNOSIS — J811 Chronic pulmonary edema: Secondary | ICD-10-CM | POA: Diagnosis present

## 2022-09-30 DIAGNOSIS — J90 Pleural effusion, not elsewhere classified: Secondary | ICD-10-CM

## 2022-09-30 DIAGNOSIS — F17210 Nicotine dependence, cigarettes, uncomplicated: Secondary | ICD-10-CM

## 2022-09-30 DIAGNOSIS — J9621 Acute and chronic respiratory failure with hypoxia: Principal | ICD-10-CM | POA: Diagnosis present

## 2022-09-30 DIAGNOSIS — A419 Sepsis, unspecified organism: Secondary | ICD-10-CM

## 2022-09-30 DIAGNOSIS — G9340 Encephalopathy, unspecified: Secondary | ICD-10-CM

## 2022-09-30 DIAGNOSIS — Z515 Encounter for palliative care: Secondary | ICD-10-CM

## 2022-09-30 DIAGNOSIS — J189 Pneumonia, unspecified organism: Secondary | ICD-10-CM

## 2022-09-30 LAB — AMMONIA: AMMONIA: 43 umol/L (ref 16–53)

## 2022-09-30 LAB — MANUAL DIFFERENTIAL
BAND %: 4 % — ABNORMAL LOW (ref 5–11)
BANDS NEUTROPHILS MANUAL: 4
LYMPHOCYTE %: 1 % — ABNORMAL LOW (ref 25–45)
LYMPHOCYTE ABSOLUTE: 0.18 10*3/uL — ABNORMAL LOW (ref 1.10–5.00)
LYMPHOCYTES MANUAL: 1
MONOCYTE %: 1 % (ref 0–12)
MONOCYTE ABSOLUTE: 0.18 10*3/uL (ref 0.00–1.30)
MONOCYTES MANUAL: 1
NEUTROPHIL %: 94 % — ABNORMAL HIGH (ref 40–76)
NEUTROPHIL ABSOLUTE: 17.64 10*3/uL — ABNORMAL HIGH (ref 1.80–8.40)
NEUTROPHILS MANUAL: 94
PLATELET MORPHOLOGY COMMENT: ADEQUATE
TOTAL CELLS COUNTED [#] IN BLOOD: 100
WBC: 18 10*3/uL

## 2022-09-30 LAB — CBC WITH DIFF
HCT: 38.1 % (ref 31.2–41.9)
HGB: 12.2 g/dL (ref 10.9–14.3)
MCH: 28.7 pg (ref 24.7–32.8)
MCHC: 31.9 g/dL — ABNORMAL LOW (ref 32.3–35.6)
MCV: 90 fL (ref 75.5–95.3)
MPV: 6.3 fL — ABNORMAL LOW (ref 7.9–10.8)
PLATELETS: 206 10*3/uL (ref 140–440)
RBC: 4.24 10*6/uL (ref 3.63–4.92)
RDW: 13.6 % (ref 12.3–17.7)
WBC: 18 10*3/uL — ABNORMAL HIGH (ref 3.8–11.8)

## 2022-09-30 LAB — BASIC METABOLIC PANEL
ANION GAP: 5 mmol/L (ref 4–13)
BUN/CREA RATIO: 16 (ref 6–22)
BUN: 17 mg/dL (ref 7–25)
CALCIUM: 8 mg/dL — ABNORMAL LOW (ref 8.6–10.3)
CHLORIDE: 98 mmol/L (ref 98–107)
CO2 TOTAL: 37 mmol/L — ABNORMAL HIGH (ref 21–31)
CREATININE: 1.04 mg/dL (ref 0.60–1.30)
ESTIMATED GFR: 54 mL/min/{1.73_m2} — ABNORMAL LOW (ref >59)
GLUCOSE: 171 mg/dL — ABNORMAL HIGH (ref 74–109)
OSMOLALITY, CALCULATED: 285 mOsm/kg (ref 270–290)
POTASSIUM: 4.2 mmol/L (ref 3.5–5.1)
SODIUM: 140 mmol/L (ref 136–145)

## 2022-09-30 LAB — TROPONIN-I: TROPONIN I: 285 ng/L — ABNORMAL HIGH (ref <15)

## 2022-09-30 LAB — DRUG SCREEN, WITH CONFIRMATION, URINE
AMPHET QL: NEGATIVE
BARB QL: NEGATIVE
BENZO QL: NEGATIVE
BUP QL: NEGATIVE
CANNAQL: NEGATIVE
COCQL: NEGATIVE
FENTANYL, RANDOM URINE: NEGATIVE
METHQL: NEGATIVE
OPIATE: POSITIVE — AB
OXYCODONE URINE: NEGATIVE
PCP QL: NEGATIVE

## 2022-09-30 LAB — LIGHT GREEN TOP TUBE

## 2022-09-30 LAB — MAGNESIUM: MAGNESIUM: 2.1 mg/dL (ref 1.9–2.7)

## 2022-09-30 MED ORDER — FAMOTIDINE 20 MG TABLET
20.0000 mg | ORAL_TABLET | Freq: Two times a day (BID) | ORAL | Status: DC
Start: 2022-09-30 — End: 2022-10-06
  Administered 2022-10-01 – 2022-10-06 (×12): 20 mg via ORAL
  Filled 2022-09-30 (×12): qty 1

## 2022-09-30 MED ORDER — LORAZEPAM 2 MG/ML INJECTION WRAPPER
2.0000 mg | INTRAMUSCULAR | Status: DC | PRN
Start: 2022-09-30 — End: 2022-10-06
  Administered 2022-09-30 – 2022-10-04 (×10): 2 mg via INTRAVENOUS
  Filled 2022-09-30 (×10): qty 1

## 2022-09-30 MED ORDER — BUDESONIDE 0.5 MG/2 ML SUSPENSION FOR NEBULIZATION
1.0000 mg | INHALATION_SUSPENSION | Freq: Two times a day (BID) | RESPIRATORY_TRACT | Status: DC
Start: 2022-09-30 — End: 2022-10-06
  Administered 2022-09-30 – 2022-10-05 (×12): 1 mg via RESPIRATORY_TRACT
  Administered 2022-10-06: 0 mg via RESPIRATORY_TRACT
  Filled 2022-09-30 (×3): qty 4

## 2022-09-30 MED ORDER — HEPARIN (PORCINE) 5,000 UNIT/ML INJECTION SOLUTION
5000.0000 [IU] | Freq: Three times a day (TID) | INTRAMUSCULAR | Status: DC
Start: 2022-09-30 — End: 2022-10-01
  Administered 2022-09-30 – 2022-10-01 (×3): 5000 [IU] via SUBCUTANEOUS
  Filled 2022-09-30 (×3): qty 1

## 2022-09-30 MED ORDER — METHYLPREDNISOLONE SOD SUCC 125 MG SOLUTION FOR INJECTION WRAPPER
62.5000 mg | Freq: Four times a day (QID) | INTRAVENOUS | Status: DC
Start: 2022-09-30 — End: 2022-10-01
  Administered 2022-09-30 – 2022-10-01 (×4): 62.5 mg via INTRAVENOUS
  Filled 2022-09-30 (×4): qty 2

## 2022-09-30 MED ORDER — IPRATROPIUM 0.5 MG-ALBUTEROL 3 MG (2.5 MG BASE)/3 ML NEBULIZATION SOLN
3.0000 mL | INHALATION_SOLUTION | Freq: Four times a day (QID) | RESPIRATORY_TRACT | Status: DC
Start: 2022-09-30 — End: 2022-10-06
  Administered 2022-09-30: 0 mL via RESPIRATORY_TRACT
  Administered 2022-09-30 (×3): 3 mL via RESPIRATORY_TRACT
  Administered 2022-10-01: 0 mL via RESPIRATORY_TRACT
  Administered 2022-10-01 – 2022-10-05 (×19): 3 mL via RESPIRATORY_TRACT
  Administered 2022-10-06: 0 mL via RESPIRATORY_TRACT
  Administered 2022-10-06 (×2): 3 mL via RESPIRATORY_TRACT
  Filled 2022-09-30 (×3): qty 3

## 2022-09-30 NOTE — Nurses Notes (Signed)
In to check on patient. Has not voided since arrival to floor. Bladder scan obtained for >425mL. Straight cath to be performed per protocol. Primary nurse made aware. Family at bedside, given update on morning labs and bladder scan.

## 2022-09-30 NOTE — Nurses Notes (Signed)
Approximately this time, chaplain in room to see patient and family

## 2022-09-30 NOTE — Progress Notes (Signed)
[place] [facility]  [facility]  IP PROGRESS NOTE      Michele Fitzgerald, Michele Fitzgerald  Date of Admission:  09/29/2022  Date of Birth:  11/26/1940  Date of Service:  09/30/2022    Hospital Day:  LOS: 1 day     Subjective:   Patient seen examined for follow-up of severe sepsis, acute on chronic respiratory failure with hypoxia, chronic obstructive pulmonary disease exacerbation, pulmonary edema, pneumonia.  Patient has been titrated to 5 L oxygen via nasal cannula.  She was requiring up to 7 L upon presentation to the emergency department.    Vital Signs:  Temp (24hrs) Max:36.8 C (98.2 F)      Temperature: 36.8 C (98.2 F)  BP (Non-Invasive): (!) 104/58  MAP (Non-Invasive): 71 mmHG  Heart Rate: 81  Respiratory Rate: 14  SpO2: 95 %    Current Medications:  budesonide (PULMICORT RESPULES) 0.5 mg/2 mL nebulizer suspension, 1 mg, Nebulization, 2x/day  cefTRIAXone (ROCEPHIN) 1 g in NS 50 mL IVPB minibag, 1 g, Intravenous, Q24H  doxycycline hyclate 100 mg in NS 100 mL IVPB minibag, 100 mg, Intravenous, Q12H  furosemide (LASIX) 10 mg/mL injection, 40 mg, Intravenous, 2x/day  ipratropium-albuterol 0.5 mg-3 mg(2.5 mg base)/3 mL Solution for Nebulization, 3 mL, Nebulization, Now  ipratropium-albuterol 0.5 mg-3 mg(2.5 mg base)/3 mL Solution for Nebulization, 3 mL, Nebulization, Q4H PRN  ipratropium-albuterol 0.5 mg-3 mg(2.5 mg base)/3 mL Solution for Nebulization, 3 mL, Nebulization, 4x/day  LORazepam (ATIVAN) 2 mg/mL injection, 2 mg, Intravenous, Q4H PRN  methylPREDNISolone sod succ (SOLU-medrol) 125 mg/2 mL injection, 62.5 mg, Intravenous, Q6H  morphine 2 mg/mL injection, 2 mg, Intravenous, Q3H PRN        Current Orders:  Active Orders   Imaging    CT BRAIN WO IV CONTRAST     Frequency: ONE TIME     Number of Occurrences: 1 Occurrences     Scheduling Instructions:               Diet    DIET NPO - NOW STRICT     Frequency: All Meals     Number of Occurrences: 1 Occurrences   Nursing    APPLY  SEQUENTIAL COMPRESSION DEVICE     Frequency: ONE TIME     Number of Occurrences: 1 Occurrences    BLADDER SCAN PRN IF NO VOID     Frequency: PRN     Number of Occurrences: 1 Days     Order Comments: If scanned urine volume greater than 300 ML, straight cath and call MD of urine cath volume. (enter secondary order for straight cath x1).  If scanned urine volume less than 300 ML rescan in 2 hours and notify MD of scanned volume.      EXTERNAL CATHETER     Frequency: PRN     Number of Occurrences: Until Specified    INTAKE AND OUTPUT Q4H     Frequency: Q4H     Number of Occurrences: Until Specified    MAINTAIN SEQUENTIAL COMPRESSION DEVICE     Frequency: CONTINUOUS     Number of Occurrences: Until Specified    Notify MD Vital Signs     Frequency: PRN     Number of Occurrences: Until Specified    NURSE TO ENTER SECONDARY ORDER Other - (specify in comments) (CHEST PAIN AND/OR ARRYTHMIA)     Frequency: UNTIL DISCONTINUED     Number of Occurrences: Until Specified    PT IS INTERMEDIATE RISK FOR VENOUS  THROMBOEMBOLISM     Frequency: CONTINUOUS     Number of Occurrences: Until Specified    PULSE OXIMETRY Q4H     Frequency: Q4H     Number of Occurrences: Until Specified    TELEMETRY MONITORING - Continuous     Frequency: CONTINUOUS     Number of Occurrences: Until Specified    VITAL SIGNS  Q4H     Frequency: Q4H     Number of Occurrences: Until Specified   Code Status    NO  CPR     Frequency: CONTINUOUS     Number of Occurrences: Until Specified   Respiratory Care    ACCUPAP SYSTEM     Frequency: Q4H     Number of Occurrences: Until Specified   Medications    budesonide (PULMICORT RESPULES) 0.5 mg/2 mL nebulizer suspension     Frequency: 2x/day     Dose: 1 mg     Route: Nebulization    cefTRIAXone (ROCEPHIN) 1 g in NS 50 mL IVPB minibag     Frequency: Q24H     Dose: 1 g     Route: Intravenous    doxycycline hyclate 100 mg in NS 100 mL IVPB minibag     Frequency: Q12H     Dose: 100 mg     Route: Intravenous    furosemide  (LASIX) 10 mg/mL injection     Frequency: 2x/day     Dose: 40 mg     Route: Intravenous    ipratropium-albuterol 0.5 mg-3 mg(2.5 mg base)/3 mL Solution for Nebulization     Frequency: Now     Dose: 3 mL     Route: Nebulization    ipratropium-albuterol 0.5 mg-3 mg(2.5 mg base)/3 mL Solution for Nebulization     Frequency: Q4H PRN     Dose: 3 mL     Route: Nebulization    ipratropium-albuterol 0.5 mg-3 mg(2.5 mg base)/3 mL Solution for Nebulization     Frequency: 4x/day     Dose: 3 mL     Route: Nebulization    LORazepam (ATIVAN) 2 mg/mL injection     Frequency: Q4H PRN     Dose: 2 mg     Route: Intravenous    methylPREDNISolone sod succ (SOLU-medrol) 125 mg/2 mL injection     Frequency: Q6H     Dose: 62.5 mg     Route: Intravenous    morphine 2 mg/mL injection     Frequency: Q3H PRN     Dose: 2 mg     Route: Intravenous        Review of Systems:  Focused review of system was completed. Refer to the HPI for ROS details.     Today's Physical Exam:  Physical Exam  Constitutional:       Appearance: She is ill-appearing.   Cardiovascular:      Rate and Rhythm: Normal rate and regular rhythm.   Pulmonary:      Breath sounds: Wheezing and rhonchi present.   Abdominal:      General: Abdomen is flat.      Palpations: Abdomen is soft.   Psychiatric:         Mood and Affect: Mood normal.          I/O:  I/O last 24 hours:    Intake/Output Summary (Last 24 hours) at 09/30/2022 1220  Last data filed at 09/29/2022 2354  Gross per 24 hour   Intake 250 ml   Output --   Net  250 ml     I/O current shift:  No intake/output data recorded.    Labs  Please indicate ordered or reviewed)  Reviewed: I have reviewed all lab results.    Problem List:  Active Hospital Problems   (*Primary Problem)    Diagnosis    Pulmonary edema    Pneumonia    Acute on chronic respiratory failure with hypoxia (CMS HCC)    COPD exacerbation (CMS HCC)    Encephalopathy       Assessment/ Plan:     Severe sepsis likely secondary to pneumonia    Acute on chronic  respiratory failure likely secondary to chronic obstructive pulmonary disease exacerbation/pneumonia/pulmonary edema:  On 5-7 L oxygen via nasal cannula.  She is normally on 3 L oxygen chronically at home.    Suspected streptococcal pneumonia:  Blood cultures pending, viral respiratory panel negative-on Rocephin and doxy    Chronic obstructive pulmonary disease exacerbation:  On Solu-Medrol titration, duo nebs/budesonide    Pulmonary edema: On IV Lasix    Metabolic encephalopathy likely secondary to pneumonia: CT head pending    Michele Newstrom, DO      DVT/PE Prophylaxis: Heparin    Disposition Planning: Home discharge      Advance Care Planning Discussed:  No

## 2022-09-30 NOTE — H&P (Signed)
[facility]  Admission H&P    Date of Service: 09/29/2022   [name], [name], 82 y.o. female  Encounter Start Date:  09/29/2022  Inpatient Admission Date: 09/29/2022  Date of Birth:  1940/05/16  PCP: Myra Gianotti Hill-Hurt, PA-C      Information Obtained from: son and family  Chief Complaint:  shortness of breath    HPI: Michele Fitzgerald is a 82 y.o., White female who presents with shortness of breath and altered mental status.  She has end-stage chronic obstructive pulmonary disease and follows with Dr. Noe Gens.  Her son states that her lungs have been a lot worse over the last 3 weeks since the increase in heat outside.  Due to poor circulation the patient is always cold so we will not allow her son to turn on her air conditioner at her house.  They say her house is stiflingly hot.  She went to Dr. Phill Mutter office last Tuesday and was given a steroid shot and a prescription for steroids but over the last couple of days has gotten worse.  She was alert and oriented today and feeling okay and even went outside a couple of times but called her son later on tonight and stated that she needed to come to the hospital. She was alert and oriented on EMS arrival but declined on the ride.  As soon as she arrived here she became unresponsive.  She was found to be septic with pneumonia and pleural effusions.  Head CT was not performed she has not been stable enough to go over to the department.  Son confirmed that patient had very specific wishes about not having CPR, intubation or any other resuscitative measures including vasopressors. Her blood pressure has steadily lowered since arrival.    PAST MEDICAL:   Past Medical History:   Diagnosis Date    COPD (chronic obstructive pulmonary disease) (CMS HCC)     CVA (cerebrovascular accident) (CMS HCC)     History of MI (myocardial infarction)     HTN (hypertension)            Medications Prior to Admission       Prescriptions    albuterol sulfate (PROVENTIL OR  VENTOLIN OR PROAIR) 90 mcg/actuation Inhalation oral inhaler    Take 1-2 Puffs by inhalation Every 6 hours as needed    ascorbic acid, vitamin C, (VITAMIN C) 500 mg Oral Tablet    Take 1 Tablet (500 mg total) by mouth Once a day    budesonide-glycopyr-formoterol (BREZTRI AEROSPHERE) 160-9-4.8 mcg/actuation Inhalation HFA Aerosol Inhaler    Take 2 Puffs by inhalation Twice daily    clopidogreL (PLAVIX) 75 mg Oral Tablet    Take 1 Tablet (75 mg total) by mouth Once a day    cyanocobalamin (VITAMIN B 12) 1,000 mcg Oral Tablet    Take 1 Tablet (1,000 mcg total) by mouth Once a day    FLUoxetine (PROZAC) 20 mg Oral Capsule    Take 1 Capsule (20 mg total) by mouth Once a day    hydroCHLOROthiazide (HYDRODIURIL) 25 mg Oral Tablet    Take 1 Tablet (25 mg total) by mouth Once a day    IPRATROPIUM 0.5 MG-ALBUTEROL 3 MG (2.5 MG BASE)/3 ML NEBULIZATION SOLN    Take 3 mL by nebulization Every 6 hours as needed for Wheezing    LORazepam (ATIVAN) 1 mg Oral Tablet    Take 1 Tablet (1 mg total) by mouth Twice per day as needed for Anxiety  Potassium Gluconate 2.5 mEq Oral Tablet    Take 1 Tablet (2.5 mEq total) by mouth Once a day    rOPINIRole (REQUIP) 0.5 mg Oral Tablet    Take 1 Tablet (0.5 mg total) by mouth Twice daily    rosuvastatin (CRESTOR) 20 mg Oral Tablet    Take 1 Tablet (20 mg total) by mouth Every evening          Allergies   Allergen Reactions    Vistaril [Hydroxyzine Hcl]  Other Adverse Reaction (Add comment)     Causes panic attack and jerking    Aspirin Nausea/ Vomiting    Bactrim [Sulfamethoxazole-Trimethoprim] Nausea/ Vomiting    Codeine  Other Adverse Reaction (Add comment)     Make her lips feel like they are swollen when they are not swollen    Darvon [Propoxyphene] Itching              Family History:  Family Medical History:    None            Social History:  Social History     Tobacco Use    Smoking status: Some Days     Current packs/day: 0.50     Types: Cigarettes     Passive exposure: Never     Smokeless tobacco: Never   Vaping Use    Vaping status: Never Used   Substance Use Topics    Alcohol use: Never    Drug use: Never        Review of Systems:  Review of Systems   Unable to perform ROS: Mental status change   All other systems reviewed and are negative.       Examination:  Temperature: 36.6 C (97.8 F) Heart Rate: (!) 104 BP (Non-Invasive): 106/60   Respiratory Rate: 17 SpO2: 98 %     Physical Exam  Constitutional:       Appearance: She is ill-appearing and toxic-appearing.   HENT:      Head: Normocephalic and atraumatic.      Right Ear: External ear normal.      Left Ear: External ear normal.      Nose: Nose normal.      Mouth/Throat:      Mouth: Mucous membranes are dry.      Pharynx: Oropharynx is clear.   Eyes:      Comments: Pale conjunctiva   Cardiovascular:      Rate and Rhythm: Normal rate and regular rhythm.      Pulses: Normal pulses.      Heart sounds: Normal heart sounds.      Comments: Occasional pvc  Pulmonary:      Breath sounds: Wheezing and rhonchi present.   Abdominal:      General: Bowel sounds are normal.      Palpations: Abdomen is soft.   Musculoskeletal:      Cervical back: Neck supple.      Comments: Decreased DP pulses, right foot cool to touch, left warm   Skin:     General: Skin is warm and dry.      Capillary Refill: Capillary refill takes less than 2 seconds.      Coloration: Skin is pale.   Neurological:      Mental Status: She is disoriented.      Comments: Responds to noxious stimuli only          Labs:    Lab Results Today:    Results for orders placed or performed during the  hospital encounter of 09/29/22 (from the past 24 hour(s))   ECG 12 LEAD   Result Value Ref Range    Ventricular rate 129 BPM    Atrial Rate 129 BPM    PR Interval 136 ms    QRS Duration 68 ms    QT Interval 262 ms    QTC Calculation 383 ms    Calculated P Axis 87 degrees    Calculated R Axis 69 degrees    Calculated T Axis 79 degrees   COVID-19, FLU A/B, RSV RAPID BY PCR   Result Value Ref Range     SARS-CoV-2 Not Detected Not Detected    INFLUENZA VIRUS TYPE A Not Detected Not Detected    INFLUENZA VIRUS TYPE B Not Detected Not Detected    RESPIRATORY SYNCTIAL VIRUS (RSV) Not Detected Not Detected   COMPREHENSIVE METABOLIC PANEL, NON-FASTING   Result Value Ref Range    SODIUM 136 136 - 145 mmol/L    POTASSIUM 4.6 3.5 - 5.1 mmol/L    CHLORIDE 95 (L) 98 - 107 mmol/L    CO2 TOTAL 35 (H) 21 - 31 mmol/L    ANION GAP 6 4 - 13 mmol/L    BUN 13 7 - 25 mg/dL    CREATININE 6.21 3.08 - 1.30 mg/dL    BUN/CREA RATIO 14 6 - 22    ESTIMATED GFR 63 >59 mL/min/1.38m^2    ALBUMIN 4.4 3.5 - 5.7 g/dL    CALCIUM 8.9 8.6 - 65.7 mg/dL    GLUCOSE 846 (H) 74 - 109 mg/dL    ALKALINE PHOSPHATASE 86 34 - 104 U/L    ALT (SGPT) 21 7 - 52 U/L    AST (SGOT) 32 13 - 39 U/L    BILIRUBIN TOTAL 0.7 0.3 - 1.2 mg/dL    PROTEIN TOTAL 7.0 6.4 - 8.9 g/dL    ALBUMIN/GLOBULIN RATIO 1.7 (H) 0.8 - 1.4    OSMOLALITY, CALCULATED 284 270 - 290 mOsm/kg    CALCIUM, CORRECTED 8.6 (L) 8.9 - 10.8 mg/dL    GLOBULIN 2.6 (L) 2.9 - 5.4   C-REACTIVE PROTEIN (CRP)   Result Value Ref Range    C-REACTIVE PROTEIN (CRP) 3.6 (H) 0.1 - 0.5 mg/dL   LACTIC ACID LEVEL W/ REFLEX FOR LEVEL >2.0   Result Value Ref Range    LACTIC ACID 2.2 0.5 - 2.2 mmol/L   B-TYPE NATRIURETIC PEPTIDE (BNP),PLASMA   Result Value Ref Range    BNP 212 (H) 5 - 100 pg/mL   CBC WITH DIFF   Result Value Ref Range    WBC 21.6 (H) 3.8 - 11.8 x10^3/uL    RBC 4.39 3.63 - 4.92 x10^6/uL    HGB 12.7 10.9 - 14.3 g/dL    HCT 96.2 95.2 - 84.1 %    MCV 91.5 75.5 - 95.3 fL    MCH 29.0 24.7 - 32.8 pg    MCHC 31.7 (L) 32.3 - 35.6 g/dL    RDW 32.4 40.1 - 02.7 %    PLATELETS 330 140 - 440 x10^3/uL    MPV 6.4 (L) 7.9 - 10.8 fL    NEUTROPHIL % 77 43 - 77 %    LYMPHOCYTE % 16 16 - 44 %    MONOCYTE % 5 5 - 13 %    EOSINOPHIL % 1 %    BASOPHIL % 1 0 - 1 %    NEUTROPHIL # 16.60 (H) 1.85 - 7.80 x10^3/uL    LYMPHOCYTE # 3.50 (H) 1.00 - 3.00  x10^3/uL    MONOCYTE # 1.00 0.30 - 1.00 x10^3/uL    EOSINOPHIL # 0.20 0.00 - 0.50  x10^3/uL    BASOPHIL # 0.20 (H) 0.00 - 0.10 x10^3/uL   PT/INR   Result Value Ref Range    PROTHROMBIN TIME 11.3 9.8 - 12.7 seconds    INR 0.97 0.84 - 1.10   PTT (PARTIAL THROMBOPLASTIN TIME)   Result Value Ref Range    APTT 31.5 25.0 - 38.0 seconds   TROPONIN NOW   Result Value Ref Range    TROPONIN I 43 (H) <15 ng/L   ARTERIAL BLOOD GAS/LACTATE   Result Value Ref Range    PH (ARTERIAL) 6.98 (LL) 7.35 - 7.45    PCO2 (ARTERIAL)      BICARBONATE (ARTERIAL)      MET-HEMOGLOBIN 0.7 <=2.0 %    LACTATE      CARBOXYHEMOGLOBIN 3.7 (H) <=1.5 %    O2CT 14.6 %    %FIO2 (ARTERIAL) 21 %    PO2 (ARTERIAL) 64 (L) 80 - 100 mm/Hg    OXYHEMOGLOBIN 78.1 (LL) 88.0 - 100.0 %    ALLEN TEST positive     DRAW SITE Radial left    LACTIC ACID - FIRST REFLEX   Result Value Ref Range    LACTIC ACID 1.1 0.5 - 2.2 mmol/L       Imaging Studies:  No results found.    DNR Status:  No CPR    Assessment/Plan:   Active Hospital Problems    Diagnosis    Hypoxic respiratory failure (CMS HCC)    Encephalopathy     1)Severe sepsis  Family wants antibiotics, medicines but no aggressive or resuscitative measures.  2)Pneumonia  Continue Rocephin, Doxycycline  3)Encephalopathy  Comfort measures only  4)End stage COPD  Add steroids, as above    DVT/PE Prophylaxis: SCDs    Jamal Maes, DO

## 2022-09-30 NOTE — Care Plan (Signed)
Problem: Adult Inpatient Plan of Care  Goal: Plan of Care Review  Outcome: Ongoing (see interventions/notes)  Goal: Patient-Specific Goal (Individualized)  Outcome: Ongoing (see interventions/notes)  Flowsheets (Taken 09/30/2022 0141)  Individualized Care Needs: assist with ADL's, monitor vital signs, IV antibiotics, IV fluids  Anxieties, Fears or Concerns: unable to assess  Patient-Specific Goals (Include Timeframe): discharge when criteria met  Plan of Care Reviewed With: family  Goal: Absence of Hospital-Acquired Illness or Injury  Outcome: Ongoing (see interventions/notes)  Intervention: Identify and Manage Fall Risk  Recent Flowsheet Documentation  Taken 09/30/2022 0200 by Francesco Runner, RN  Safety Promotion/Fall Prevention:   activity supervised   fall prevention program maintained   nonskid shoes/slippers when out of bed   safety round/check completed  Intervention: Prevent Skin Injury  Recent Flowsheet Documentation  Taken 09/30/2022 0200 by Francesco Runner, RN  Body Position: supine, head elevated  Goal: Optimal Comfort and Wellbeing  Outcome: Ongoing (see interventions/notes)  Goal: Rounds/Family Conference  Outcome: Ongoing (see interventions/notes)     Problem: Fall Injury Risk  Goal: Absence of Fall and Fall-Related Injury  Outcome: Ongoing (see interventions/notes)  Intervention: Promote Injury-Free Environment  Recent Flowsheet Documentation  Taken 09/30/2022 0200 by Francesco Runner, RN  Safety Promotion/Fall Prevention:   activity supervised   fall prevention program maintained   nonskid shoes/slippers when out of bed   safety round/check completed     Problem: Skin Injury Risk Increased  Goal: Skin Health and Integrity  Outcome: Ongoing (see interventions/notes)  Intervention: Optimize Skin Protection  Recent Flowsheet Documentation  Taken 09/30/2022 0200 by Francesco Runner, RN  Activity Management: bedrest  Head of Bed (HOB) Positioning: HOB at 60 degrees

## 2022-09-30 NOTE — Nurses Notes (Signed)
8657: bladder scanned patient at this time due to not voiding since arrival to floor and family request, patient bladder scanned for 374 ml, patient's sheets were wet upon arrival to floor, notified E.Smith, NP of above information, instructed by provider to not straight cath until bladder scan >500 ml. Patient now opening eyes to voice and light touch, occasional mumble "huh" when being called "Michele Fitzgerald."

## 2022-09-30 NOTE — ED Nurses Note (Signed)
Report called to [place] on 3S at this time. Informed of family at bedside and incoming chaplain.

## 2022-09-30 NOTE — Respiratory Therapy (Signed)
Unable to educate patient at this time for tobacco cessation.  She is currently confused and combative

## 2022-10-01 DIAGNOSIS — J9622 Acute and chronic respiratory failure with hypercapnia: Secondary | ICD-10-CM

## 2022-10-01 DIAGNOSIS — R7989 Other specified abnormal findings of blood chemistry: Secondary | ICD-10-CM

## 2022-10-01 DIAGNOSIS — G9341 Metabolic encephalopathy: Secondary | ICD-10-CM

## 2022-10-01 DIAGNOSIS — J440 Chronic obstructive pulmonary disease with acute lower respiratory infection: Secondary | ICD-10-CM

## 2022-10-01 DIAGNOSIS — R7881 Bacteremia: Secondary | ICD-10-CM

## 2022-10-01 DIAGNOSIS — J9621 Acute and chronic respiratory failure with hypoxia: Secondary | ICD-10-CM

## 2022-10-01 DIAGNOSIS — I2510 Atherosclerotic heart disease of native coronary artery without angina pectoris: Secondary | ICD-10-CM

## 2022-10-01 DIAGNOSIS — I10 Essential (primary) hypertension: Secondary | ICD-10-CM

## 2022-10-01 LAB — COMPREHENSIVE METABOLIC PANEL, NON-FASTING
ALBUMIN/GLOBULIN RATIO: 1.4 (ref 0.8–1.4)
ALBUMIN: 3.5 g/dL (ref 3.5–5.7)
ALKALINE PHOSPHATASE: 67 U/L (ref 34–104)
ALT (SGPT): 16 U/L (ref 7–52)
ANION GAP: 5 mmol/L (ref 4–13)
AST (SGOT): 24 U/L (ref 13–39)
BILIRUBIN TOTAL: 0.3 mg/dL (ref 0.3–1.2)
BUN/CREA RATIO: 24 — ABNORMAL HIGH (ref 6–22)
BUN: 25 mg/dL (ref 7–25)
CALCIUM, CORRECTED: 8.8 mg/dL — ABNORMAL LOW (ref 8.9–10.8)
CALCIUM: 8.4 mg/dL — ABNORMAL LOW (ref 8.6–10.3)
CHLORIDE: 92 mmol/L — ABNORMAL LOW (ref 98–107)
CO2 TOTAL: 45 mmol/L (ref 21–31)
CREATININE: 1.03 mg/dL (ref 0.60–1.30)
ESTIMATED GFR: 55 mL/min/{1.73_m2} — ABNORMAL LOW (ref >59)
GLOBULIN: 2.5 — ABNORMAL LOW (ref 2.9–5.4)
GLUCOSE: 132 mg/dL — ABNORMAL HIGH (ref 74–109)
OSMOLALITY, CALCULATED: 290 mOsm/kg (ref 270–290)
POTASSIUM: 3.7 mmol/L (ref 3.5–5.1)
PROTEIN TOTAL: 6 g/dL — ABNORMAL LOW (ref 6.4–8.9)
SODIUM: 142 mmol/L (ref 136–145)

## 2022-10-01 LAB — ARTERIAL BLOOD GAS/LACTATE
%FIO2 (ARTERIAL): 36 %
BASE EXCESS (ARTERIAL): 21.2 mmol/L — ABNORMAL HIGH (ref 0.0–2.0)
BICARBONATE (ARTERIAL): 40.1 mmol/L — ABNORMAL HIGH (ref 20.0–26.0)
CARBOXYHEMOGLOBIN: 1.2 % (ref <=1.5)
MET-HEMOGLOBIN: 0 % (ref <=2.0)
O2CT: 21.9 %
OXYHEMOGLOBIN: 95.5 % (ref 88.0–100.0)
PCO2 (ARTERIAL): 83 mm/Hg (ref 35–45)
PH (ARTERIAL): 7.36 (ref 7.35–7.45)
PO2 (ARTERIAL): 77 mm/Hg — ABNORMAL LOW (ref 80–100)

## 2022-10-01 LAB — CBC WITH DIFF
BASOPHIL #: 0 10*3/uL (ref 0.00–0.10)
BASOPHIL %: 0 % (ref 0–1)
EOSINOPHIL #: 0 10*3/uL (ref 0.00–0.50)
EOSINOPHIL %: 0 % — ABNORMAL LOW
HCT: 35.4 % (ref 31.2–41.9)
HGB: 11.4 g/dL (ref 10.9–14.3)
LYMPHOCYTE #: 0.3 10*3/uL — ABNORMAL LOW (ref 1.00–3.00)
LYMPHOCYTE %: 3 % — ABNORMAL LOW (ref 16–44)
MCH: 28.6 pg (ref 24.7–32.8)
MCHC: 32.2 g/dL — ABNORMAL LOW (ref 32.3–35.6)
MCV: 88.8 fL (ref 75.5–95.3)
MONOCYTE #: 0.2 10*3/uL — ABNORMAL LOW (ref 0.30–1.00)
MONOCYTE %: 2 % — ABNORMAL LOW (ref 5–13)
MPV: 7 fL — ABNORMAL LOW (ref 7.9–10.8)
NEUTROPHIL #: 11.2 10*3/uL — ABNORMAL HIGH (ref 1.85–7.80)
NEUTROPHIL %: 95 % — ABNORMAL HIGH (ref 43–77)
PLATELETS: 177 10*3/uL (ref 140–440)
RBC: 3.98 10*6/uL (ref 3.63–4.92)
RDW: 13.1 % (ref 12.3–17.7)
WBC: 11.7 10*3/uL (ref 3.8–11.8)

## 2022-10-01 LAB — TROPONIN-I
TROPONIN I: 808 ng/L — ABNORMAL HIGH (ref <15)
TROPONIN I: 883 ng/L — ABNORMAL HIGH (ref <15)
TROPONIN I: 973 ng/L — ABNORMAL HIGH (ref <15)
TROPONIN I: 848 ng/L — ABNORMAL HIGH (ref <15)

## 2022-10-01 LAB — SCAN DIFFERENTIAL: PLATELET MORPHOLOGY COMMENT: ADEQUATE

## 2022-10-01 LAB — MAGNESIUM: MAGNESIUM: 2 mg/dL (ref 1.9–2.7)

## 2022-10-01 MED ORDER — GUAIFENESIN ER 600 MG TABLET, EXTENDED RELEASE 12 HR
600.0000 mg | EXTENDED_RELEASE_TABLET | Freq: Two times a day (BID) | ORAL | Status: DC
Start: 2022-10-01 — End: 2022-10-06
  Administered 2022-10-01 – 2022-10-06 (×11): 600 mg via ORAL
  Filled 2022-10-01 (×11): qty 1

## 2022-10-01 MED ORDER — METOPROLOL TARTRATE 25 MG TABLET
12.5000 mg | ORAL_TABLET | Freq: Two times a day (BID) | ORAL | Status: DC
Start: 2022-10-01 — End: 2022-10-06
  Administered 2022-10-01 – 2022-10-03 (×6): 12.5 mg via ORAL
  Administered 2022-10-04: 0 mg via ORAL
  Administered 2022-10-04 – 2022-10-06 (×4): 12.5 mg via ORAL
  Filled 2022-10-01 (×11): qty 1

## 2022-10-01 MED ORDER — ENOXAPARIN 60 MG/0.6 ML SUBCUTANEOUS SYRINGE
60.0000 mg | INJECTION | Freq: Two times a day (BID) | SUBCUTANEOUS | Status: DC
Start: 2022-10-01 — End: 2022-10-03
  Administered 2022-10-01 – 2022-10-03 (×4): 60 mg via SUBCUTANEOUS
  Filled 2022-10-01 (×3): qty 0.6

## 2022-10-01 MED ORDER — METHYLPREDNISOLONE SOD SUCCINATE 40 MG/ML SOLUTION FOR INJ. WRAPPER
40.0000 mg | Freq: Two times a day (BID) | INTRAMUSCULAR | Status: DC
Start: 2022-10-01 — End: 2022-10-02
  Administered 2022-10-01 – 2022-10-02 (×2): 40 mg via INTRAVENOUS
  Filled 2022-10-01 (×2): qty 1

## 2022-10-01 MED ORDER — ASPIRIN 81 MG CHEWABLE TABLET
81.0000 mg | CHEWABLE_TABLET | Freq: Every day | ORAL | Status: DC
Start: 2022-10-01 — End: 2022-10-06
  Administered 2022-10-01 – 2022-10-06 (×6): 81 mg via ORAL
  Filled 2022-10-01 (×6): qty 1

## 2022-10-01 MED ORDER — ATORVASTATIN 40 MG TABLET
40.0000 mg | ORAL_TABLET | Freq: Every evening | ORAL | Status: DC
Start: 2022-10-01 — End: 2022-10-06
  Administered 2022-10-01 – 2022-10-05 (×5): 40 mg via ORAL
  Filled 2022-10-01 (×5): qty 1

## 2022-10-01 NOTE — Respiratory Therapy (Signed)
Patient found on 4L NC, Placed back on BiPAP at this time with settings below, AccuPAP therapy provided.          10/01/22 1417   BiPAP/CPAP Assessment   Start Time 1430   $ Bipap Check S   Bipap Status Placed On Bipap   $ O2 Delivery BP   Oxygen Concentration (%) 40   Circuit Checked   Proximal  Temperature 25 C (77 F)   H2O Bag Checked   Hepa Filter Checked   BiPAP/CPAP Check   Type of Mask FFM   Facial Skin Integrity WNL   Device V-60    Mode S/T   Spontaneous Volume 711 mL   Set Rate 20   Spontaneous Rate 27 Breaths Per Minute   Minute Volume 11.3 LPM   FiO2 Set 40%   IPAP 18 cmH20   EPAP 8 cmH2O   PIP 19 cmH2O   Rise Time % 2   I-Time 0.85 seconds   SpO2 96 %   Alarms   Audible Alarms Checked & Functioning   Hi Rate 50   Lo Rate 5   Hi Pressure 50 cmH2O   Lo Pressure 5 cmH2O   Low Volume 100 m L   Apnea Alarm 20 Seconds   Low Min Ventilation 2   Timepoint   Stop Time 1432   Check Duration 2 minutes

## 2022-10-01 NOTE — Respiratory Therapy (Signed)
Patient taken off BiPAP, patient ready to eat dinner. Placed on 4L NC, POX 98%

## 2022-10-01 NOTE — Respiratory Therapy (Signed)
Per MD order patient placed on BiPAP in settings below. DuoNeb and Pulmicort TX given, along with AccuPAP therapy. Patient able to do AccuPAP with a pressure of 5, not able to tolerate 10.          10/01/22 0955   BiPAP/CPAP Assessment   Start Time 1002   $ Bipap Check I   Bipap Status Placed On Bipap   $ O2 Delivery BP   Oxygen Concentration (%) 40   Circuit Initial   Proximal  Temperature 31 C (87.8 F)   H2O Bag Initial   Hepa Filter Initial   BiPAP/CPAP Check   Type of Mask FFM   Facial Skin Integrity WNL   Device V-60    Mode S/T   Spontaneous Volume 494 mL   Set Rate 20   Spontaneous Rate 4 Breaths Per Minute   Minute Volume 17.5 LPM   FiO2 Set 40%   IPAP 18 cmH20   EPAP 8 cmH2O   PIP 21 cmH2O   Rise Time % 2   I-Time 0.85 seconds   SpO2 97 %   Alarms   Audible Alarms Checked & Functioning   Hi Rate 50   Lo Rate 5   Hi Pressure 50 cmH2O   Lo Pressure 5 cmH2O   Low Volume 100 m L   Apnea Alarm 20 Seconds   Low Min Ventilation 2.0   Timepoint   Stop Time 1005   Check Duration 3 minutes

## 2022-10-01 NOTE — Progress Notes (Signed)
10/01/2022  12:28  Michele Fitzgerald  [id number]    History:  82 year old female with past medical history of chronic obstructive pulmonary disease baseline 3 L oxygen at home, history of coronary disease, hypertension, presented to the hospital with acute on chronic hypoxic hypercapnic respiratory failure secondary to chronic obstructive pulmonary disease exacerbation with suspected pneumococcal pneumonia and pulmonary edema.  Patient was being brought into the ER by EMS for worsening shortness of breath and in route to the hospital became altered and lethargic and unresponsive.  CT head was obtained showing no acute process.  Patient's family stated that she was a DNR and they did not want any heroic measures but would like to continue with medical management.  On admission her ABG showed a pH below 7 with pCO2 not calculated, concerning for severe hypercapnic respiratory failure.    Today:  Per nurses, patient was pretty lethargic and unresponsive for the last couple days, however today is opening her eyes and talking to me.  She is somewhat confused but is agreeable to trying BiPAP.  I think at this point that her confusion is coming from the severe hypercapnic respiratory failure.  Serum bicarbonate is increasing showing the body is trying to compensate for her severe acidosis.  Troponin level was initially mildly elevated on arrival so was repeated today and found to be pretty significantly elevated.  She was not complaining of any chest pain during my exam.  It has continued to climb close to 900, will continue to monitor for now.  EKG showed ST changes but appear unchanged compared to previous EKG.  Called and spoke with family who has clarified that they want everything treated aggressively with out CPR or intubation.  They would even consider interventional Cardiology if necessary but would like to defer to her as she becomes more oriented.  Given the fact that her EKG has not changed for couple days, combined  with lack of cardiac symptoms at this time, I do not feel that she has having an acute STEMI.  Feel that may be more related to type 2 or type 1 non ST elevation myocardial infarction.  Will order echocardiogram and consult Cardiology as well as initiate acute coronary syndrome medications for now.    Past Medical History  FLUoxetine, LORazepam, Potassium Gluconate, albuterol sulfate, ascorbic acid (vitamin C), budesonide-glycopyr-formoterol, clopidogreL, cyanocobalamin, hydroCHLOROthiazide, ipratropium-albuteroL, loratadine, nystatin, predniSONE, rOPINIRole, and rosuvastatin   Allergies   Allergen Reactions    Vistaril [Hydroxyzine Hcl]  Other Adverse Reaction (Add comment)     Causes panic attack and jerking    Aspirin Nausea/ Vomiting    Bactrim [Sulfamethoxazole-Trimethoprim] Nausea/ Vomiting    Codeine  Other Adverse Reaction (Add comment)     Make her lips feel like they are swollen when they are not swollen    Darvon [Propoxyphene] Itching     Past Medical History:   Diagnosis Date    COPD (chronic obstructive pulmonary disease) (CMS HCC)     CVA (cerebrovascular accident) (CMS HCC)     History of MI (myocardial infarction)     HTN (hypertension)          History reviewed. No past surgical history pertinent negatives.      Family Medical History:    None         Social History     Socioeconomic History    Marital status: Widowed   Tobacco Use    Smoking status: Some Days     Current packs/day: 0.50  Types: Cigarettes     Passive exposure: Never    Smokeless tobacco: Never   Vaping Use    Vaping status: Never Used   Substance and Sexual Activity    Alcohol use: Never    Drug use: Never     Social Determinants of Health     Social Connections: Unknown (09/30/2022)    Social Connections     SDOH Social Isolation: Patient chooses not to answer       Physical exam:  BP (!) 124/58   Pulse 82   Temp 36.7 C (98 F)   Resp 18   Ht 1.524 m (5')   Wt 61.9 kg (136 lb 9 oz)   SpO2 94%   BMI 26.67 kg/m       Gen:  This is a 82 y.o. female who is awake and alert, mild disorientation about situation  CV:  Regular rate and rhythm without murmurs rubs or gallops.  2+ pedal and radial pulses. No clubbing, cyanosis, or edema.  RESP: Diminished bilaterally without wheezes rales or rhonchi.  Respirations are nonlabored.  GI:  Abdomen is soft, nontender, nondistended.  Bowel sounds normoactive.  MSK:  Full range of motion of upper and lower extremities  NEURO:  Cranial nerves 2-12 grossly intact.  No focal deficits as tested.  SKIN: Warm, dry, intact, without lesions, rashes, or ulcerations    Diagnostic Labs:  Results for orders placed or performed during the hospital encounter of 09/29/22 (from the past 24 hour(s))   AMMONIA   Result Value Ref Range    AMMONIA 43 16 - 53 umol/L   CBC/DIFF    Narrative    The following orders were created for panel order CBC/DIFF.  Procedure                               Abnormality         Status                     ---------                               -----------         ------                     CBC WITH DIFF[[id number]]                Abnormal            Final result                 Please view results for these tests on the individual orders.   COMPREHENSIVE METABOLIC PANEL, NON-FASTING   Result Value Ref Range    SODIUM 142 136 - 145 mmol/L    POTASSIUM 3.7 3.5 - 5.1 mmol/L    CHLORIDE 92 (L) 98 - 107 mmol/L    CO2 TOTAL 45 (HH) 21 - 31 mmol/L    ANION GAP 5 4 - 13 mmol/L    BUN 25 7 - 25 mg/dL    CREATININE 2.72 5.36 - 1.30 mg/dL    BUN/CREA RATIO 24 (H) 6 - 22    ESTIMATED GFR 55 (L) >59 mL/min/1.96m^2    ALBUMIN 3.5 3.5 - 5.7 g/dL    CALCIUM 8.4 (L) 8.6 - 10.3 mg/dL    GLUCOSE 644 (H) 74 -  109 mg/dL    ALKALINE PHOSPHATASE 67 34 - 104 U/L    ALT (SGPT) 16 7 - 52 U/L    AST (SGOT) 24 13 - 39 U/L    BILIRUBIN TOTAL 0.3 0.3 - 1.2 mg/dL    PROTEIN TOTAL 6.0 (L) 6.4 - 8.9 g/dL    ALBUMIN/GLOBULIN RATIO 1.4 0.8 - 1.4    OSMOLALITY, CALCULATED 290 270 - 290 mOsm/kg    CALCIUM, CORRECTED 8.8 (L)  8.9 - 10.8 mg/dL    GLOBULIN 2.5 (L) 2.9 - 5.4    Narrative    Estimated Glomerular Filtration Rate (eGFR) is calculated using the CKD-EPI (2021) equation, intended for patients 5 years of age and older. If gender is not documented or "unknown", there will be no eGFR calculation.     MAGNESIUM   Result Value Ref Range    MAGNESIUM 2.0 1.9 - 2.7 mg/dL   CBC WITH DIFF   Result Value Ref Range    WBC 11.7 3.8 - 11.8 x10^3/uL    RBC 3.98 3.63 - 4.92 x10^6/uL    HGB 11.4 10.9 - 14.3 g/dL    HCT 16.1 09.6 - 04.5 %    MCV 88.8 75.5 - 95.3 fL    MCH 28.6 24.7 - 32.8 pg    MCHC 32.2 (L) 32.3 - 35.6 g/dL    RDW 40.9 81.1 - 91.4 %    PLATELETS 177 140 - 440 x10^3/uL    MPV 7.0 (L) 7.9 - 10.8 fL    NEUTROPHIL % 95 (H) 43 - 77 %    LYMPHOCYTE % 3 (L) 16 - 44 %    MONOCYTE % 2 (L) 5 - 13 %    EOSINOPHIL % 0 (L) %    BASOPHIL % 0 0 - 1 %    NEUTROPHIL # 11.20 (H) 1.85 - 7.80 x10^3/uL    LYMPHOCYTE # 0.30 (L) 1.00 - 3.00 x10^3/uL    MONOCYTE # 0.20 (L) 0.30 - 1.00 x10^3/uL    EOSINOPHIL # 0.00 0.00 - 0.50 x10^3/uL    BASOPHIL # 0.00 0.00 - 0.10 x10^3/uL   SCAN DIFFERENTIAL   Result Value Ref Range    ANISOCYTOSIS 1+ (10-25%)     PLATELET MORPHOLOGY COMMENT Adequate    TROPONIN-I   Result Value Ref Range    TROPONIN I 808 (H) <15 ng/L   TROPONIN-I   Result Value Ref Range    TROPONIN I 883 (H) <15 ng/L       Diagnostic Imaging:  CT BRAIN WO IV CONTRAST   Final Result   NO ACUTE FINDINGS         One or more dose reduction techniques were used (e.g., Automated exposure control, adjustment of the mA and/or kV according to patient size, use of iterative reconstruction technique).         Radiologist location ID: [id number]         XR AP MOBILE CHEST   Final Result   1. SEPTAL THICKENING SUGGESTING INTERSTITIAL EDEMA   2. FOCAL RIGHT UPPER LOBE OPACITY. SUPERIMPOSED INFECTION IS NOT EXCLUDED.            Radiologist location ID: [id number]              Assesment/Plan:  Patient Active Problem List   Diagnosis    Insomnia    Mild mood  disorder (CMS HCC)    GAD (generalized anxiety disorder)    Panic disorder (episodic paroxysmal anxiety)    Encephalopathy  Pulmonary edema    Pneumonia    Acute on chronic respiratory failure with hypoxia (CMS HCC)    COPD exacerbation (CMS HCC)        Acute on chronic hypoxic hypercapnic respiratory failure secondary to underlying pneumonia and chronic obstructive pulmonary disease exacerbation  Continue with antibiotics   Duo nebs scheduled and PRN   Steroids   Budesonide  Mucinex    Metabolic encephalopathy secondary to hypercapnic respiratory failure  Start BIPAP Q4H  ABG after BIPAP removed    Elevated troponin with abnormal EKG  Will initiate medical management for MI   Continue to trend troponin  Cardiology consulted   Spoke with son who states that he feels most likely that she would not want intervention but they would at least like to speak with the specialist regarding the possibility and discuss risks versus benefit    Positive blood culture  Suspect contamination   Repeat blood cultures pending    COPD 3L  CAD  HTN    DVT Prophylaxis: Lovenox  Diet: Cardiac  Physical Therapy: Consulted  Disposition: Home    On the day of the encounter, a total of  45 minutes was spent on this patient encounter including review of historical information, examination, documentation and post-visit activities. The time documented excludes procedural time.    Ebbie Ridge, DO

## 2022-10-02 DIAGNOSIS — I249 Acute ischemic heart disease, unspecified: Secondary | ICD-10-CM

## 2022-10-02 DIAGNOSIS — I214 Non-ST elevation (NSTEMI) myocardial infarction: Secondary | ICD-10-CM

## 2022-10-02 DIAGNOSIS — Z9981 Dependence on supplemental oxygen: Secondary | ICD-10-CM

## 2022-10-02 LAB — CBC WITH DIFF
BASOPHIL #: 0 10*3/uL (ref 0.00–0.10)
BASOPHIL %: 0 % (ref 0–1)
EOSINOPHIL #: 0 10*3/uL (ref 0.00–0.50)
EOSINOPHIL %: 0 % — ABNORMAL LOW
HCT: 35.5 % (ref 31.2–41.9)
HGB: 11.3 g/dL (ref 10.9–14.3)
LYMPHOCYTE #: 0.6 10*3/uL — ABNORMAL LOW (ref 1.00–3.00)
LYMPHOCYTE %: 5 % — ABNORMAL LOW (ref 16–44)
MCH: 28.5 pg (ref 24.7–32.8)
MCHC: 32 g/dL — ABNORMAL LOW (ref 32.3–35.6)
MCV: 89.1 fL (ref 75.5–95.3)
MONOCYTE #: 0.8 10*3/uL (ref 0.30–1.00)
MONOCYTE %: 6 % (ref 5–13)
MPV: 6.8 fL — ABNORMAL LOW (ref 7.9–10.8)
NEUTROPHIL #: 11.9 10*3/uL — ABNORMAL HIGH (ref 1.85–7.80)
NEUTROPHIL %: 89 % — ABNORMAL HIGH (ref 43–77)
PLATELETS: 220 10*3/uL (ref 140–440)
RBC: 3.98 10*6/uL (ref 3.63–4.92)
RDW: 13.6 % (ref 12.3–17.7)
WBC: 13.3 10*3/uL — ABNORMAL HIGH (ref 3.8–11.8)

## 2022-10-02 LAB — COMPREHENSIVE METABOLIC PANEL, NON-FASTING
ALBUMIN/GLOBULIN RATIO: 1.4 (ref 0.8–1.4)
ALBUMIN: 3.4 g/dL — ABNORMAL LOW (ref 3.5–5.7)
ALKALINE PHOSPHATASE: 61 U/L (ref 34–104)
ALT (SGPT): 13 U/L (ref 7–52)
ANION GAP: 5 mmol/L (ref 4–13)
AST (SGOT): 19 U/L (ref 13–39)
BILIRUBIN TOTAL: 0.4 mg/dL (ref 0.3–1.2)
BUN/CREA RATIO: 31 — ABNORMAL HIGH (ref 6–22)
BUN: 29 mg/dL — ABNORMAL HIGH (ref 7–25)
CALCIUM, CORRECTED: 9.1 mg/dL (ref 8.9–10.8)
CALCIUM: 8.6 mg/dL (ref 8.6–10.3)
CHLORIDE: 94 mmol/L — ABNORMAL LOW (ref 98–107)
CO2 TOTAL: 43 mmol/L (ref 21–31)
CREATININE: 0.95 mg/dL (ref 0.60–1.30)
ESTIMATED GFR: 60 mL/min/{1.73_m2} (ref >59)
GLOBULIN: 2.5 — ABNORMAL LOW (ref 2.9–5.4)
GLUCOSE: 117 mg/dL — ABNORMAL HIGH (ref 74–109)
OSMOLALITY, CALCULATED: 290 mOsm/kg (ref 270–290)
POTASSIUM: 3.9 mmol/L (ref 3.5–5.1)
PROTEIN TOTAL: 5.9 g/dL — ABNORMAL LOW (ref 6.4–8.9)
SODIUM: 142 mmol/L (ref 136–145)

## 2022-10-02 LAB — MAGNESIUM: MAGNESIUM: 2 mg/dL (ref 1.9–2.7)

## 2022-10-02 MED ORDER — ROPINIROLE 0.5 MG TABLET
0.5000 mg | ORAL_TABLET | Freq: Two times a day (BID) | ORAL | Status: DC
Start: 2022-10-02 — End: 2022-10-06
  Administered 2022-10-02 – 2022-10-06 (×9): 0.5 mg via ORAL
  Filled 2022-10-02 (×9): qty 1

## 2022-10-02 MED ORDER — CLOPIDOGREL 75 MG TABLET
75.0000 mg | ORAL_TABLET | Freq: Every day | ORAL | Status: DC
Start: 2022-10-02 — End: 2022-10-02

## 2022-10-02 MED ORDER — ASCORBIC ACID (VITAMIN C) 500 MG TABLET
500.0000 mg | ORAL_TABLET | Freq: Every day | ORAL | Status: DC
Start: 2022-10-02 — End: 2022-10-06
  Administered 2022-10-02 – 2022-10-06 (×5): 500 mg via ORAL
  Filled 2022-10-02 (×5): qty 1

## 2022-10-02 MED ORDER — FLUOXETINE 20 MG CAPSULE
20.0000 mg | ORAL_CAPSULE | Freq: Every day | ORAL | Status: DC
Start: 2022-10-02 — End: 2022-10-06
  Administered 2022-10-02 – 2022-10-03 (×2): 20 mg via ORAL
  Administered 2022-10-04: 0 mg via ORAL
  Filled 2022-10-02 (×3): qty 1

## 2022-10-02 MED ORDER — METHYLPREDNISOLONE SOD SUCCINATE 40 MG/ML SOLUTION FOR INJ. WRAPPER
40.0000 mg | Freq: Every day | INTRAMUSCULAR | Status: DC
Start: 2022-10-03 — End: 2022-10-06
  Administered 2022-10-03 – 2022-10-06 (×4): 40 mg via INTRAVENOUS
  Filled 2022-10-02 (×4): qty 1

## 2022-10-02 MED ORDER — LORATADINE 10 MG TABLET
10.0000 mg | ORAL_TABLET | Freq: Every day | ORAL | Status: DC
Start: 2022-10-02 — End: 2022-10-06
  Administered 2022-10-02 – 2022-10-06 (×5): 10 mg via ORAL
  Filled 2022-10-02 (×5): qty 1

## 2022-10-02 MED ORDER — CYANOCOBALAMIN (VIT B-12) 250 MCG TABLET
1000.0000 ug | ORAL_TABLET | Freq: Every day | ORAL | Status: DC
Start: 2022-10-02 — End: 2022-10-06
  Administered 2022-10-02 – 2022-10-06 (×5): 1000 ug via ORAL
  Filled 2022-10-02 (×5): qty 4

## 2022-10-02 MED ORDER — CLOPIDOGREL 75 MG TABLET
75.0000 mg | ORAL_TABLET | Freq: Every day | ORAL | Status: DC
Start: 2022-10-02 — End: 2022-10-06
  Administered 2022-10-02 – 2022-10-06 (×5): 75 mg via ORAL
  Filled 2022-10-02 (×5): qty 1

## 2022-10-02 NOTE — Telemedicine Consult (Signed)
[facility]  Cardiology   Consult      TELEMEDICINE CARDIOLOGY CONSULT NOTE    Telemedicine Documentation:   Modality: Video  Provider Physical Location: [place], [facility]  Patient/family aware of provider location: Yes  Patient/family consent for telemedicine: Yes  Examination observed and performed by: Amedeo Kinsman, APRN,FNP-BC, Dr. Warnell Bureau  Patient Location: [facility]    Name:  Michele Fitzgerald   DOB: [birth year]   MRN: [id number]   Date:  09/29/2022     PCP: Myra Gianotti Hill-Hurt, PA-C   Hospital Day:  LOS: 3 days   Chief Complaint: Shortness of Breath and Altered Mental Status      IMPRESSION/PLAN:  Sepsis/pneumonia  End-stage chronic obstructive pulmonary disease  NSTEMI, likely type 2   Encephalopathy    Echo:  Obtain echo family is agreeable.    Non ST elevation myocardial infarction, likely type 2 but cannot completely rule out acute coronary syndrome.  Patient is currently asymptomatic.  Recommend 48 hours of heparin if family is agreeable.  Also obtain echo.  Restart home Plavix and continue aspirin, Lipitor, and metoprolol.    HPI:  Michele Fitzgerald is a 82 y.o. female with past medical history for end-stage chronic obstructive pulmonary disease, prior MI (details unknown), hypertension, CVA, and anxiety who presented with worsening shortness of breath.  She is on chronic O2 at home.  She recently received steroid injection from PCP symptoms were not improving so she came to emergency room.  Apparently, EN route she became more confused and by the time she got to the ER she was unresponsive.  She has made herself a DNR previously and does not want to be intubated.  Family was agreeable to medical management with antibiotics.  She was found to be septic from pneumonia with pleural effusions.  Troponin 43, 285, 808, 883, 973, 848.  BNP 212.  CT of the brain negative.  Chest x-ray showed interstitial edema and focal right upper lobe opacity.  EKG on 06/04 did  have some ST changes in the anterolateral leads, repeated on 06/06 and showed significant ST depression with deep T-waves in the anterolateral leads. She denies chest pain on exam, but is quite lethargic. No family at bedside.      Past Medical History:   Diagnosis Date    COPD (chronic obstructive pulmonary disease) (CMS HCC)     CVA (cerebrovascular accident) (CMS [facility])     History of MI (myocardial infarction)     HTN (hypertension)          Patient Active Problem List    Diagnosis Date Noted    Pulmonary edema 09/30/2022    Pneumonia 09/30/2022    Acute on chronic respiratory failure with hypoxia (CMS HCC) 09/30/2022    COPD exacerbation (CMS HCC) 09/30/2022    Encephalopathy 09/29/2022    Insomnia 05/07/2021    Mild mood disorder (CMS HCC) 05/07/2021    GAD (generalized anxiety disorder) 05/07/2021    Panic disorder (episodic paroxysmal anxiety) 05/07/2021      History reviewed. No past surgical history pertinent negatives.       aspirin chewable tablet 81 mg, 81 mg, Oral, Daily  atorvastatin (LIPITOR) tablet, 40 mg, Oral, QPM  budesonide (PULMICORT RESPULES) 0.5 mg/2 mL nebulizer suspension, 1 mg, Nebulization, 2x/day  cefTRIAXone (ROCEPHIN) 1 g in NS 50 mL IVPB minibag, 1 g, Intravenous, Q24H  doxycycline hyclate 100 mg in NS 100 mL IVPB minibag, 100 mg, Intravenous, Q12H  enoxaparin  PF (LOVENOX) 60 mg/0.6 mL SubQ injection, 60 mg, Subcutaneous, Q12H  famotidine (PEPCID) tablet, 20 mg, Oral, 2x/day  furosemide (LASIX) 10 mg/mL injection, 40 mg, Intravenous, 2x/day  guaiFENesin (MUCINEX) extended release tablet - for cough (expectorant), 600 mg, Oral, 2x/day  ipratropium-albuterol 0.5 mg-3 mg(2.5 mg base)/3 mL Solution for Nebulization, 3 mL, Nebulization, Now  ipratropium-albuterol 0.5 mg-3 mg(2.5 mg base)/3 mL Solution for Nebulization, 3 mL, Nebulization, Q4H PRN  ipratropium-albuterol 0.5 mg-3 mg(2.5 mg base)/3 mL Solution for Nebulization, 3 mL, Nebulization, 4x/day  LORazepam (ATIVAN) 2 mg/mL injection, 2  mg, Intravenous, Q4H PRN  methylPREDNISolone sod succ (SOLU-medrol) 40 mg/mL injection, 40 mg, Intravenous, Q12H  metoprolol tartrate (LOPRESSOR) tablet, 12.5 mg, Oral, 2x/day  morphine 2 mg/mL injection, 2 mg, Intravenous, Q3H PRN         Allergies   Allergen Reactions    Vistaril [Hydroxyzine Hcl]  Other Adverse Reaction (Add comment)     Causes panic attack and jerking    Aspirin Nausea/ Vomiting    Bactrim [Sulfamethoxazole-Trimethoprim] Nausea/ Vomiting    Codeine  Other Adverse Reaction (Add comment)     Make her lips feel like they are swollen when they are not swollen    Darvon [Propoxyphene] Itching     Family Medical History:    None         Social History     Tobacco Use    Smoking status: Some Days     Current packs/day: 0.50     Types: Cigarettes     Passive exposure: Never    Smokeless tobacco: Never   Substance Use Topics    Alcohol use: Never       ROS: All other ROS are negative except for what is noted in HPI; most information obtained from medical records     I/O last 24 hours:    Intake/Output Summary (Last 24 hours) at 10/02/2022 0640  Last data filed at 10/02/2022 0600  Gross per 24 hour   Intake 610 ml   Output 426 ml   Net 184 ml     I/O current shift:  07/06 1900 - 07/07 0659  In: 390 [P.O.:240]  Out: 426 [Urine:426]    DIAGNOSTIC STUDIES:    Results for orders placed or performed during the hospital encounter of 09/29/22 (from the past 24 hour(s))   TROPONIN-I   Result Value Ref Range    TROPONIN I 808 (H) <15 ng/L   TROPONIN-I   Result Value Ref Range    TROPONIN I 883 (H) <15 ng/L   ARTERIAL BLOOD GAS/LACTATE   Result Value Ref Range    PH (ARTERIAL) 7.36 7.35 - 7.45    PCO2 (ARTERIAL) 83 (HH) 35 - 45 mm/Hg    BICARBONATE (ARTERIAL) 40.1 (H) 20.0 - 26.0 mmol/L    BASE EXCESS (ARTERIAL) 21.2 (H) 0.0 - 2.0 mmol/L    MET-HEMOGLOBIN 0.0 <=2.0 %    LACTATE      CARBOXYHEMOGLOBIN 1.2 <=1.5 %    O2CT 21.9 %    %FIO2 (ARTERIAL) 36 %    PO2 (ARTERIAL) 77 (L) 80 - 100 mm/Hg    OXYHEMOGLOBIN 95.5 88.0 -  100.0 %    ALLEN TEST yes     DRAW SITE RR    TROPONIN-I   Result Value Ref Range    TROPONIN I 973 (H) <15 ng/L   TROPONIN-I   Result Value Ref Range    TROPONIN I 848 (H) <15 ng/L   CBC/DIFF  Narrative    The following orders were created for panel order CBC/DIFF.  Procedure                               Abnormality         Status                     ---------                               -----------         ------                     CBC WITH HKVQ[[id number]]                Abnormal            Final result                 Please view results for these tests on the individual orders.   COMPREHENSIVE METABOLIC PANEL, NON-FASTING   Result Value Ref Range    SODIUM 142 136 - 145 mmol/L    POTASSIUM 3.9 3.5 - 5.1 mmol/L    CHLORIDE 94 (L) 98 - 107 mmol/L    CO2 TOTAL 43 (HH) 21 - 31 mmol/L    ANION GAP 5 4 - 13 mmol/L    BUN 29 (H) 7 - 25 mg/dL    CREATININE 6.43 3.29 - 1.30 mg/dL    BUN/CREA RATIO 31 (H) 6 - 22    ESTIMATED GFR 60 >59 mL/min/1.48m^2    ALBUMIN 3.4 (L) 3.5 - 5.7 g/dL    CALCIUM 8.6 8.6 - 51.8 mg/dL    GLUCOSE 841 (H) 74 - 109 mg/dL    ALKALINE PHOSPHATASE 61 34 - 104 U/L    ALT (SGPT) 13 7 - 52 U/L    AST (SGOT) 19 13 - 39 U/L    BILIRUBIN TOTAL 0.4 0.3 - 1.2 mg/dL    PROTEIN TOTAL 5.9 (L) 6.4 - 8.9 g/dL    ALBUMIN/GLOBULIN RATIO 1.4 0.8 - 1.4    OSMOLALITY, CALCULATED 290 270 - 290 mOsm/kg    CALCIUM, CORRECTED 9.1 8.9 - 10.8 mg/dL    GLOBULIN 2.5 (L) 2.9 - 5.4    Narrative    Estimated Glomerular Filtration Rate (eGFR) is calculated using the CKD-EPI (2021) equation, intended for patients 84 years of age and older. If gender is not documented or "unknown", there will be no eGFR calculation.     MAGNESIUM   Result Value Ref Range    MAGNESIUM 2.0 1.9 - 2.7 mg/dL   CBC WITH DIFF   Result Value Ref Range    WBC 13.3 (H) 3.8 - 11.8 x10^3/uL    RBC 3.98 3.63 - 4.92 x10^6/uL    HGB 11.3 10.9 - 14.3 g/dL    HCT 66.0 63.0 - 16.0 %    MCV 89.1 75.5 - 95.3 fL    MCH 28.5 24.7 - 32.8 pg    MCHC 32.0 (L) 32.3  - 35.6 g/dL    RDW 10.9 32.3 - 55.7 %    PLATELETS 220 140 - 440 x10^3/uL    MPV 6.8 (L) 7.9 - 10.8 fL    NEUTROPHIL % 89 (H) 43 - 77 %    LYMPHOCYTE % 5 (L) 16 - 44 %  MONOCYTE % 6 5 - 13 %    EOSINOPHIL % 0 (L) %    BASOPHIL % 0 0 - 1 %    NEUTROPHIL # 11.90 (H) 1.85 - 7.80 x10^3/uL    LYMPHOCYTE # 0.60 (L) 1.00 - 3.00 x10^3/uL    MONOCYTE # 0.80 0.30 - 1.00 x10^3/uL    EOSINOPHIL # 0.00 0.00 - 0.50 x10^3/uL    BASOPHIL # 0.00 0.00 - 0.10 x10^3/uL        Cardiopulmonary/Radiology:  CT BRAIN WO IV CONTRAST    Result Date: 09/30/2022  Impression NO ACUTE FINDINGS One or more dose reduction techniques were used (e.g., Automated exposure control, adjustment of the mA and/or kV according to patient size, use of iterative reconstruction technique). Radiologist location ID: [id number]     XR AP MOBILE CHEST    Result Date: 09/29/2022  Impression 1. SEPTAL THICKENING SUGGESTING INTERSTITIAL EDEMA 2. FOCAL RIGHT UPPER LOBE OPACITY. SUPERIMPOSED INFECTION IS NOT EXCLUDED. Radiologist location ID: [id number]      No results found for this or any previous visit (from the past 2400 hour(s)).     PHYSICAL EXAMINATION:  BP 138/69   Pulse 73   Temp 36.1 C (97 F)   Resp 18   Ht 1.524 m (5')   Wt 61.2 kg (135 lb)   SpO2 97%   BMI 26.37 kg/m     General: No acute distress and appears stated age.    HEENT: Head normocephalic, atraumatic.  Mucouse membranes moist.    Neck: No JVD, no carotid bruit.    Lungs: Rhonchi to posterior bases to auscultation bilaterally.    Cardiovascular: Regular rate and rhythm, normal S1-S2 without murmur, no gallop or rub.    Abdomen: Soft, bowel sounds normal.    Extremities: No edema.  Skin: Skin warm and dry.    Neurologic: Lethargic, would arouse but not answer questions.    I participated and evaluated the patient as part of a collaborative telemedicine service.  See Dr. Cordelia Pen addendum for additional information.  My findings from this visit are as stated above.  The co-signing  physician did not participate in the management of this patient unless otherwise noted.      Amedeo Kinsman, APRN,FNP-BC 10/02/2022 06:40    Late entry for 10/02/22. I saw and examined the patient.  I reviewed the resident's note.  I agree with the findings and plan of care as documented in the resident's note.  Any exceptions/additions are edited/noted.    Brayton Layman, DO

## 2022-10-02 NOTE — Progress Notes (Signed)
10/02/2022  11:03  Michele Fitzgerald  [id number]    History:  82 year old female with past medical history of chronic obstructive pulmonary disease baseline 3 L oxygen at home, history of coronary disease, hypertension, presented to the hospital with acute on chronic hypoxic hypercapnic respiratory failure secondary to chronic obstructive pulmonary disease exacerbation with suspected pneumococcal pneumonia and pulmonary edema.  Patient was being brought into the ER by EMS for worsening shortness of breath and in route to the hospital became altered and lethargic and unresponsive.  CT head was obtained showing no acute process.  Patient's family stated that she was a DNR and they did not want any heroic measures but would like to continue with medical management.  On admission her ABG showed a pH below 7 with pCO2 not calculated, concerning for severe hypercapnic respiratory failure.    7/7:  Per nurses, patient was pretty lethargic and unresponsive for the last couple days, however today is opening her eyes and talking to me.  She is somewhat confused but is agreeable to trying BiPAP.  I think at this point that her confusion is coming from the severe hypercapnic respiratory failure.  Serum bicarbonate is increasing showing the body is trying to compensate for her severe acidosis.  Troponin level was initially mildly elevated on arrival so was repeated today and found to be pretty significantly elevated.  She was not complaining of any chest pain during my exam.  It has continued to climb close to 900, will continue to monitor for now.  EKG showed ST changes but appear unchanged compared to previous EKG.  Called and spoke with family who has clarified that they want everything treated aggressively with out CPR or intubation.  They would even consider interventional Cardiology if necessary but would like to defer to her as she becomes more oriented.  Given the fact that her EKG has not changed for couple days, combined  with lack of cardiac symptoms at this time, I do not feel that she has having an acute STEMI.  Feel that may be more related to type 2 or type 1 non ST elevation myocardial infarction.  Will order echocardiogram and consult Cardiology as well as initiate acute coronary syndrome medications for now.    7/8 Patient feeling ok. Chest pain free. Trop down trending. Still slightly lethargic    Past Medical History  FLUoxetine, LORazepam, Potassium Gluconate, albuterol sulfate, ascorbic acid (vitamin C), budesonide-glycopyr-formoterol, clopidogreL, cyanocobalamin, hydroCHLOROthiazide, ipratropium-albuteroL, loratadine, nystatin, predniSONE, rOPINIRole, and rosuvastatin   Allergies   Allergen Reactions    Vistaril [Hydroxyzine Hcl]  Other Adverse Reaction (Add comment)     Causes panic attack and jerking    Aspirin Nausea/ Vomiting    Bactrim [Sulfamethoxazole-Trimethoprim] Nausea/ Vomiting    Codeine  Other Adverse Reaction (Add comment)     Make her lips feel like they are swollen when they are not swollen    Darvon [Propoxyphene] Itching     Past Medical History:   Diagnosis Date    COPD (chronic obstructive pulmonary disease) (CMS HCC)     CVA (cerebrovascular accident) (CMS HCC)     History of MI (myocardial infarction)     HTN (hypertension)          History reviewed. No past surgical history pertinent negatives.      Family Medical History:    None         Social History     Socioeconomic History    Marital status: Widowed  Tobacco Use    Smoking status: Some Days     Current packs/day: 0.50     Types: Cigarettes     Passive exposure: Never    Smokeless tobacco: Never   Vaping Use    Vaping status: Never Used   Substance and Sexual Activity    Alcohol use: Never    Drug use: Never     Social Determinants of Health     Social Connections: Unknown (09/30/2022)    Social Connections     SDOH Social Isolation: Patient chooses not to answer       Physical exam:  BP (!) 148/85   Pulse 80   Temp 36.9 C (98.4 F)   Resp  18   Ht 1.524 m (5')   Wt 61.2 kg (135 lb)   SpO2 97%   BMI 26.37 kg/m       Gen: This is a 82 y.o. female who is awake and alert, mild disorientation about situation  CV:  Regular rate and rhythm without murmurs rubs or gallops.  2+ pedal and radial pulses. No clubbing, cyanosis, or edema.  RESP: Diminished bilaterally without rales or rhonchi. Exp wheezes heard BL. Respirations are nonlabored.  GI:  Abdomen is soft, nontender, nondistended.  Bowel sounds normoactive.  MSK:  Full range of motion of upper and lower extremities  NEURO:  Cranial nerves 2-12 grossly intact.  No focal deficits as tested.  SKIN: Warm, dry, intact, without lesions, rashes, or ulcerations    Diagnostic Labs:  Results for orders placed or performed during the hospital encounter of 09/29/22 (from the past 24 hour(s))   ARTERIAL BLOOD GAS/LACTATE   Result Value Ref Range    PH (ARTERIAL) 7.36 7.35 - 7.45    PCO2 (ARTERIAL) 83 (HH) 35 - 45 mm/Hg    BICARBONATE (ARTERIAL) 40.1 (H) 20.0 - 26.0 mmol/L    BASE EXCESS (ARTERIAL) 21.2 (H) 0.0 - 2.0 mmol/L    MET-HEMOGLOBIN 0.0 <=2.0 %    LACTATE      CARBOXYHEMOGLOBIN 1.2 <=1.5 %    O2CT 21.9 %    %FIO2 (ARTERIAL) 36 %    PO2 (ARTERIAL) 77 (L) 80 - 100 mm/Hg    OXYHEMOGLOBIN 95.5 88.0 - 100.0 %    ALLEN TEST yes     DRAW SITE RR    TROPONIN-I   Result Value Ref Range    TROPONIN I 973 (H) <15 ng/L   TROPONIN-I   Result Value Ref Range    TROPONIN I 848 (H) <15 ng/L   CBC/DIFF    Narrative    The following orders were created for panel order CBC/DIFF.  Procedure                               Abnormality         Status                     ---------                               -----------         ------                     CBC WITH [id number]]                Abnormal  Final result                 Please view results for these tests on the individual orders.   COMPREHENSIVE METABOLIC PANEL, NON-FASTING   Result Value Ref Range    SODIUM 142 136 - 145 mmol/L    POTASSIUM 3.9 3.5 -  5.1 mmol/L    CHLORIDE 94 (L) 98 - 107 mmol/L    CO2 TOTAL 43 (HH) 21 - 31 mmol/L    ANION GAP 5 4 - 13 mmol/L    BUN 29 (H) 7 - 25 mg/dL    CREATININE 6.57 8.46 - 1.30 mg/dL    BUN/CREA RATIO 31 (H) 6 - 22    ESTIMATED GFR 60 >59 mL/min/1.94m^2    ALBUMIN 3.4 (L) 3.5 - 5.7 g/dL    CALCIUM 8.6 8.6 - 96.2 mg/dL    GLUCOSE 952 (H) 74 - 109 mg/dL    ALKALINE PHOSPHATASE 61 34 - 104 U/L    ALT (SGPT) 13 7 - 52 U/L    AST (SGOT) 19 13 - 39 U/L    BILIRUBIN TOTAL 0.4 0.3 - 1.2 mg/dL    PROTEIN TOTAL 5.9 (L) 6.4 - 8.9 g/dL    ALBUMIN/GLOBULIN RATIO 1.4 0.8 - 1.4    OSMOLALITY, CALCULATED 290 270 - 290 mOsm/kg    CALCIUM, CORRECTED 9.1 8.9 - 10.8 mg/dL    GLOBULIN 2.5 (L) 2.9 - 5.4    Narrative    Estimated Glomerular Filtration Rate (eGFR) is calculated using the CKD-EPI (2021) equation, intended for patients 91 years of age and older. If gender is not documented or "unknown", there will be no eGFR calculation.     MAGNESIUM   Result Value Ref Range    MAGNESIUM 2.0 1.9 - 2.7 mg/dL   CBC WITH DIFF   Result Value Ref Range    WBC 13.3 (H) 3.8 - 11.8 x10^3/uL    RBC 3.98 3.63 - 4.92 x10^6/uL    HGB 11.3 10.9 - 14.3 g/dL    HCT 84.1 32.4 - 40.1 %    MCV 89.1 75.5 - 95.3 fL    MCH 28.5 24.7 - 32.8 pg    MCHC 32.0 (L) 32.3 - 35.6 g/dL    RDW 02.7 25.3 - 66.4 %    PLATELETS 220 140 - 440 x10^3/uL    MPV 6.8 (L) 7.9 - 10.8 fL    NEUTROPHIL % 89 (H) 43 - 77 %    LYMPHOCYTE % 5 (L) 16 - 44 %    MONOCYTE % 6 5 - 13 %    EOSINOPHIL % 0 (L) %    BASOPHIL % 0 0 - 1 %    NEUTROPHIL # 11.90 (H) 1.85 - 7.80 x10^3/uL    LYMPHOCYTE # 0.60 (L) 1.00 - 3.00 x10^3/uL    MONOCYTE # 0.80 0.30 - 1.00 x10^3/uL    EOSINOPHIL # 0.00 0.00 - 0.50 x10^3/uL    BASOPHIL # 0.00 0.00 - 0.10 x10^3/uL       Diagnostic Imaging:  CT BRAIN WO IV CONTRAST   Final Result   NO ACUTE FINDINGS         One or more dose reduction techniques were used (e.g., Automated exposure control, adjustment of the mA and/or kV according to patient size, use of iterative  reconstruction technique).         Radiologist location ID: [id number]         XR AP MOBILE CHEST   Final Result  1. SEPTAL THICKENING SUGGESTING INTERSTITIAL EDEMA   2. FOCAL RIGHT UPPER LOBE OPACITY. SUPERIMPOSED INFECTION IS NOT EXCLUDED.            Radiologist location ID: [id number]              Assesment/Plan:  Patient Active Problem List   Diagnosis    Insomnia    Mild mood disorder (CMS HCC)    GAD (generalized anxiety disorder)    Panic disorder (episodic paroxysmal anxiety)    Encephalopathy    Pulmonary edema    Pneumonia    Acute on chronic respiratory failure with hypoxia (CMS HCC)    COPD exacerbation (CMS HCC)        Acute on chronic hypoxic hypercapnic respiratory failure secondary to underlying pneumonia and chronic obstructive pulmonary disease exacerbation  Continue with antibiotics   Duo nebs scheduled and PRN   Steroids   Budesonide  Mucinex    Metabolic encephalopathy secondary to hypercapnic respiratory failure  BIPAP QHS and while sleeping    Type II NSTEMI vs ACS  Will initiate medical management for MI   Troponin downtrending  Cardiology consulted   Echo pending      Positive blood culture  Suspect contamination   Repeat blood cultures pending    COPD 3L  CAD  HTN    DVT Prophylaxis: Lovenox  Diet: Cardiac  Physical Therapy: Consulted  Disposition: Home    On the day of the encounter, a total of  45 minutes was spent on this patient encounter including review of historical information, examination, documentation and post-visit activities. The time documented excludes procedural time.    Ebbie Ridge, DO

## 2022-10-03 ENCOUNTER — Other Ambulatory Visit: Payer: Self-pay

## 2022-10-03 ENCOUNTER — Inpatient Hospital Stay (HOSPITAL_COMMUNITY): Payer: Medicare Other

## 2022-10-03 DIAGNOSIS — R000 Tachycardia, unspecified: Secondary | ICD-10-CM

## 2022-10-03 DIAGNOSIS — R9431 Abnormal electrocardiogram [ECG] [EKG]: Secondary | ICD-10-CM

## 2022-10-03 DIAGNOSIS — I498 Other specified cardiac arrhythmias: Secondary | ICD-10-CM

## 2022-10-03 DIAGNOSIS — I493 Ventricular premature depolarization: Secondary | ICD-10-CM

## 2022-10-03 DIAGNOSIS — I519 Heart disease, unspecified: Secondary | ICD-10-CM

## 2022-10-03 DIAGNOSIS — I3139 Other pericardial effusion (noninflammatory): Secondary | ICD-10-CM

## 2022-10-03 LAB — CBC WITH DIFF
BASOPHIL #: 0 10*3/uL (ref 0.00–0.10)
BASOPHIL %: 1 % (ref 0–1)
EOSINOPHIL #: 0 10*3/uL (ref 0.00–0.50)
EOSINOPHIL %: 0 % — ABNORMAL LOW
HCT: 36.9 % (ref 31.2–41.9)
HGB: 12.1 g/dL (ref 10.9–14.3)
LYMPHOCYTE #: 1.5 10*3/uL (ref 1.00–3.00)
LYMPHOCYTE %: 15 % — ABNORMAL LOW (ref 16–44)
MCH: 29.1 pg (ref 24.7–32.8)
MCHC: 32.7 g/dL (ref 32.3–35.6)
MCV: 89 fL (ref 75.5–95.3)
MONOCYTE #: 0.6 10*3/uL (ref 0.30–1.00)
MONOCYTE %: 5 % (ref 5–13)
MPV: 6.8 fL — ABNORMAL LOW (ref 7.9–10.8)
NEUTROPHIL #: 8.2 10*3/uL — ABNORMAL HIGH (ref 1.85–7.80)
NEUTROPHIL %: 79 % — ABNORMAL HIGH (ref 43–77)
PLATELETS: 214 10*3/uL (ref 140–440)
RBC: 4.15 10*6/uL (ref 3.63–4.92)
RDW: 13.5 % (ref 12.3–17.7)
WBC: 10.3 10*3/uL (ref 3.8–11.8)

## 2022-10-03 LAB — COMPREHENSIVE METABOLIC PANEL, NON-FASTING
ALBUMIN/GLOBULIN RATIO: 1.5 — ABNORMAL HIGH (ref 0.8–1.4)
ALBUMIN: 3.5 g/dL (ref 3.5–5.7)
ALKALINE PHOSPHATASE: 60 U/L (ref 34–104)
ALT (SGPT): 13 U/L (ref 7–52)
ANION GAP: 6 mmol/L (ref 4–13)
AST (SGOT): 15 U/L (ref 13–39)
BILIRUBIN TOTAL: 0.6 mg/dL (ref 0.3–1.2)
BUN/CREA RATIO: 38 — ABNORMAL HIGH (ref 6–22)
BUN: 34 mg/dL — ABNORMAL HIGH (ref 7–25)
CALCIUM, CORRECTED: 9 mg/dL (ref 8.9–10.8)
CALCIUM: 8.6 mg/dL (ref 8.6–10.3)
CHLORIDE: 96 mmol/L — ABNORMAL LOW (ref 98–107)
CO2 TOTAL: 41 mmol/L (ref 21–31)
CREATININE: 0.89 mg/dL (ref 0.60–1.30)
ESTIMATED GFR: 65 mL/min/{1.73_m2} (ref >59)
GLOBULIN: 2.3 — ABNORMAL LOW (ref 2.9–5.4)
GLUCOSE: 94 mg/dL (ref 74–109)
OSMOLALITY, CALCULATED: 292 mOsm/kg — ABNORMAL HIGH (ref 270–290)
POTASSIUM: 3.8 mmol/L (ref 3.5–5.1)
PROTEIN TOTAL: 5.8 g/dL — ABNORMAL LOW (ref 6.4–8.9)
SODIUM: 143 mmol/L (ref 136–145)

## 2022-10-03 LAB — ARTERIAL BLOOD GAS/LACTATE
%FIO2 (ARTERIAL): 32 %
BASE EXCESS (ARTERIAL): 20.2 mmol/L — ABNORMAL HIGH (ref 0.0–2.0)
BICARBONATE (ARTERIAL): 40.1 mmol/L — ABNORMAL HIGH (ref 20.0–26.0)
CARBOXYHEMOGLOBIN: 1.6 % — ABNORMAL HIGH (ref <=1.5)
MET-HEMOGLOBIN: 0.1 % (ref <=2.0)
O2CT: 16.4 %
OXYHEMOGLOBIN: 93.6 % (ref 88.0–100.0)
PCO2 (ARTERIAL): 87 mm/Hg (ref 35–45)
PH (ARTERIAL): 7.33 — ABNORMAL LOW (ref 7.35–7.45)
PO2 (ARTERIAL): 76 mm/Hg — ABNORMAL LOW (ref 80–100)

## 2022-10-03 LAB — ECG 12 LEAD
Atrial Rate: 103 {beats}/min
Atrial Rate: 98 {beats}/min
Calculated P Axis: 80 degrees
Calculated P Axis: 74 degrees
Calculated R Axis: 61 degrees
Calculated R Axis: 62 degrees
Calculated T Axis: 176 degrees
Calculated T Axis: 165 degrees
PR Interval: 118 ms
PR Interval: 116 ms
QRS Duration: 74 ms
QRS Duration: 80 ms
QT Interval: 430 ms
QT Interval: 444 ms
QTC Calculation: 563 ms
QTC Calculation: 566 ms
Ventricular rate: 103 {beats}/min
Ventricular rate: 98 {beats}/min

## 2022-10-03 LAB — MAGNESIUM: MAGNESIUM: 2 mg/dL (ref 1.9–2.7)

## 2022-10-03 MED ORDER — ENOXAPARIN 40 MG/0.4 ML SUBCUTANEOUS SYRINGE
40.0000 mg | INJECTION | Freq: Every day | SUBCUTANEOUS | Status: DC
Start: 2022-10-04 — End: 2022-10-06
  Administered 2022-10-04 – 2022-10-06 (×3): 40 mg via SUBCUTANEOUS
  Filled 2022-10-03 (×3): qty 0.4

## 2022-10-03 MED ORDER — SODIUM CHLORIDE 0.9 % INJECTION SOLUTION
2.0000 mL | Freq: Once | INTRAVENOUS | Status: DC | PRN
Start: 2022-10-03 — End: 2022-10-06
  Administered 2022-10-03: 5 mL via INTRAVENOUS

## 2022-10-03 NOTE — Respiratory Therapy (Signed)
10/03/22 1013   BiPAP/CPAP Assessment   Start Time 1013   $ Bipap Check S   Bipap Status Placed On Bipap   $ O2 Delivery BP   Oxygen Concentration (%) 40   Oxygen Noting/Safety Checks Oxygen Apparatus Checked and Functioning Properly;Oxygen Flowmeter Checked and Functioning Properly   Circuit Checked   Proximal  Temperature   (passover humidification)   H2O Bag Checked   Hepa Filter Checked   BiPAP/CPAP Check   Type of Mask FFM   NG/OG Placed No   Facial Skin Integrity WNL   Device V-60    Mode S/T   Spontaneous Volume 566 mL   Set Rate 20   Spontaneous Rate 24 Breaths Per Minute   Minute Volume 12.9 LPM   FiO2 Set 40%   IPAP 18 cmH20   EPAP 8 cmH2O   PIP 19 cmH2O   Rise Time % 2   I-Time 0.85 seconds   SpO2 97 %   Alarms   Audible Alarms Checked & Functioning   Hi Rate 40   Lo Rate 5   Hi Pressure 40 cmH2O   Lo Pressure 5 cmH2O   Low Volume 200 m L   Apnea Alarm 20 Seconds   Low Min Ventilation 2   Timepoint   Stop Time 1019   Check Duration 6 minutes       Pt does not want to wear BIPAP. RT and RN at bedside, we were able to talk pt into wearing BIPAP. Pt stated that she ripped it off while asleep last night because she does not like wearing it. Pt seems a little confused at this time. ABG results show increased  CO2 levels. Doctor is aware and ordered a continual BIPAP.  Hopefully pt will wear BIPAP long enough to be affective

## 2022-10-03 NOTE — Respiratory Therapy (Signed)
10/03/22 0943   BiPAP/CPAP Assessment   Start Time 0956   Bipap Status Found Off Bipap and On   $ O2 Delivery NC   Oxygen Concentration (%) 32   Flow (L/min) (Oxygen Therapy) 3   Oxygen Noting/Safety Checks Oxygen Apparatus Checked and Functioning Properly;Oxygen Flowmeter Checked and Functioning Properly;Patient in No Apparent Respiratory Distress     Upon RT arrival, ABG was drawn, after ABG was drawn neb was given. Pt was not wearing BiPAP. Pt was on 3lpm nasal cannula.

## 2022-10-03 NOTE — Progress Notes (Signed)
10/03/2022  10:09  Michele Fitzgerald  [id number]    History:  82 year old female with past medical history of chronic obstructive pulmonary disease baseline 3 L oxygen at home, history of coronary disease, hypertension, presented to the hospital with acute on chronic hypoxic hypercapnic respiratory failure secondary to chronic obstructive pulmonary disease exacerbation with suspected pneumococcal pneumonia and pulmonary edema.  Patient was being brought into the ER by EMS for worsening shortness of breath and in route to the hospital became altered and lethargic and unresponsive.  CT head was obtained showing no acute process.  Patient's family stated that she was a DNR and they did not want any heroic measures but would like to continue with medical management.  On admission her ABG showed a pH below 7 with pCO2 not calculated, concerning for severe hypercapnic respiratory failure. On 7/7, Per nurses, patient was pretty lethargic and unresponsive for the last couple days, however today is opening her eyes and talking to me.  She is somewhat confused but is agreeable to trying BiPAP.  I think at this point that her confusion was coming from the severe hypercapnic respiratory failure.  Serum bicarbonate is increasing showing the body is trying to compensate for her severe acidosis.  Troponin level was initially mildly elevated on arrival so was repeated and found to be pretty significantly elevated.  She was not complaining of any chest pain during my exam.  It has continued to climb close to 900, will continue to monitor for now.  EKG showed ST changes but appear unchanged compared to previous EKG.  Repeat EKG showed T-wave inversions.  Called and spoke with family who has clarified that they want everything treated aggressively without CPR or intubation.  They would even consider interventional Cardiology if necessary but would like to defer to her as she becomes more oriented. Will order echocardiogram and consult  Cardiology as well as initiate acute coronary syndrome medical management for now.    Today:  Patient is still pretty lethargic today.  Echocardiogram pending at this time, awaiting further recommendations from Cardiology.  Repeat ABG showing evidence of worsening hypercapnia.    Past Medical History  FLUoxetine, LORazepam, Potassium Gluconate, albuterol sulfate, ascorbic acid (vitamin C), budesonide-glycopyr-formoterol, clopidogreL, cyanocobalamin, hydroCHLOROthiazide, ipratropium-albuteroL, loratadine, nystatin, predniSONE, rOPINIRole, and rosuvastatin   Allergies   Allergen Reactions    Vistaril [Hydroxyzine Hcl]  Other Adverse Reaction (Add comment)     Causes panic attack and jerking    Aspirin Nausea/ Vomiting    Bactrim [Sulfamethoxazole-Trimethoprim] Nausea/ Vomiting    Codeine  Other Adverse Reaction (Add comment)     Make her lips feel like they are swollen when they are not swollen    Darvon [Propoxyphene] Itching     Past Medical History:   Diagnosis Date    COPD (chronic obstructive pulmonary disease) (CMS HCC)     CVA (cerebrovascular accident) (CMS HCC)     History of MI (myocardial infarction)     HTN (hypertension)          History reviewed. No past surgical history pertinent negatives.      Family Medical History:    None         Social History     Socioeconomic History    Marital status: Widowed   Tobacco Use    Smoking status: Some Days     Current packs/day: 0.50     Types: Cigarettes     Passive exposure: Never    Smokeless tobacco: Never  Vaping Use    Vaping status: Never Used   Substance and Sexual Activity    Alcohol use: Never    Drug use: Never     Social Determinants of Health     Social Connections: Unknown (09/30/2022)    Social Connections     SDOH Social Isolation: Patient chooses not to answer       Physical exam:  BP (!) 129/59   Pulse 73   Temp 36.2 C (97.2 F)   Resp 18   Ht 1.524 m (5')   Wt 60.4 kg (133 lb 1 oz)   SpO2 95%   BMI 25.99 kg/m       Gen: This is a 82 y.o.  female who is awake but lethargic, mild disorientation about situation  CV:  Regular rate and rhythm without murmurs rubs or gallops.  2+ pedal and radial pulses. No clubbing, cyanosis, or edema.  RESP: Diminished bilaterally without rales or rhonchi. Respirations are nonlabored.  GI:  Abdomen is soft, nontender, nondistended.  Bowel sounds normoactive.  MSK:  Full range of motion of upper and lower extremities  NEURO:  Cranial nerves 2-12 grossly intact.  No focal deficits as tested.  SKIN: Warm, dry, intact, without lesions, rashes, or ulcerations    Diagnostic Labs:  Results for orders placed or performed during the hospital encounter of 09/29/22 (from the past 24 hour(s))   CBC/DIFF    Narrative    The following orders were created for panel order CBC/DIFF.  Procedure                               Abnormality         Status                     ---------                               -----------         ------                     CBC WITH ZOXW[[id number]]                Abnormal            Final result                 Please view results for these tests on the individual orders.   COMPREHENSIVE METABOLIC PANEL, NON-FASTING   Result Value Ref Range    SODIUM 143 136 - 145 mmol/L    POTASSIUM 3.8 3.5 - 5.1 mmol/L    CHLORIDE 96 (L) 98 - 107 mmol/L    CO2 TOTAL 41 (HH) 21 - 31 mmol/L    ANION GAP 6 4 - 13 mmol/L    BUN 34 (H) 7 - 25 mg/dL    CREATININE 1.19 1.47 - 1.30 mg/dL    BUN/CREA RATIO 38 (H) 6 - 22    ESTIMATED GFR 65 >59 mL/min/1.59m^2    ALBUMIN 3.5 3.5 - 5.7 g/dL    CALCIUM 8.6 8.6 - 82.9 mg/dL    GLUCOSE 94 74 - 562 mg/dL    ALKALINE PHOSPHATASE 60 34 - 104 U/L    ALT (SGPT) 13 7 - 52 U/L    AST (SGOT) 15 13 - 39 U/L    BILIRUBIN TOTAL 0.6  0.3 - 1.2 mg/dL    PROTEIN TOTAL 5.8 (L) 6.4 - 8.9 g/dL    ALBUMIN/GLOBULIN RATIO 1.5 (H) 0.8 - 1.4    OSMOLALITY, CALCULATED 292 (H) 270 - 290 mOsm/kg    CALCIUM, CORRECTED 9.0 8.9 - 10.8 mg/dL    GLOBULIN 2.3 (L) 2.9 - 5.4    Narrative    Estimated Glomerular  Filtration Rate (eGFR) is calculated using the CKD-EPI (2021) equation, intended for patients 9 years of age and older. If gender is not documented or "unknown", there will be no eGFR calculation.     MAGNESIUM   Result Value Ref Range    MAGNESIUM 2.0 1.9 - 2.7 mg/dL   CBC WITH DIFF   Result Value Ref Range    WBC 10.3 3.8 - 11.8 x10^3/uL    RBC 4.15 3.63 - 4.92 x10^6/uL    HGB 12.1 10.9 - 14.3 g/dL    HCT 16.1 09.6 - 04.5 %    MCV 89.0 75.5 - 95.3 fL    MCH 29.1 24.7 - 32.8 pg    MCHC 32.7 32.3 - 35.6 g/dL    RDW 40.9 81.1 - 91.4 %    PLATELETS 214 140 - 440 x10^3/uL    MPV 6.8 (L) 7.9 - 10.8 fL    NEUTROPHIL % 79 (H) 43 - 77 %    LYMPHOCYTE % 15 (L) 16 - 44 %    MONOCYTE % 5 5 - 13 %    EOSINOPHIL % 0 (L) %    BASOPHIL % 1 0 - 1 %    NEUTROPHIL # 8.20 (H) 1.85 - 7.80 x10^3/uL    LYMPHOCYTE # 1.50 1.00 - 3.00 x10^3/uL    MONOCYTE # 0.60 0.30 - 1.00 x10^3/uL    EOSINOPHIL # 0.00 0.00 - 0.50 x10^3/uL    BASOPHIL # 0.00 0.00 - 0.10 x10^3/uL   ARTERIAL BLOOD GAS/LACTATE   Result Value Ref Range    PH (ARTERIAL) 7.33 (L) 7.35 - 7.45    PCO2 (ARTERIAL) 87 (HH) 35 - 45 mm/Hg    BICARBONATE (ARTERIAL) 40.1 (H) 20.0 - 26.0 mmol/L    BASE EXCESS (ARTERIAL) 20.2 (H) 0.0 - 2.0 mmol/L    MET-HEMOGLOBIN 0.1 <=2.0 %    LACTATE      CARBOXYHEMOGLOBIN 1.6 (H) <=1.5 %    O2CT 16.4 %    %FIO2 (ARTERIAL) 32 %    PO2 (ARTERIAL) 76 (L) 80 - 100 mm/Hg    OXYHEMOGLOBIN 93.6 88.0 - 100.0 %    ALLEN TEST Yes     DRAW SITE LR        Diagnostic Imaging:  CT BRAIN WO IV CONTRAST   Final Result   NO ACUTE FINDINGS         One or more dose reduction techniques were used (e.g., Automated exposure control, adjustment of the mA and/or kV according to patient size, use of iterative reconstruction technique).         Radiologist location ID: [id number]         XR AP MOBILE CHEST   Final Result   1. SEPTAL THICKENING SUGGESTING INTERSTITIAL EDEMA   2. FOCAL RIGHT UPPER LOBE OPACITY. SUPERIMPOSED INFECTION IS NOT EXCLUDED.            Radiologist  location ID: [id number]              Assesment/Plan:  Patient Active Problem List   Diagnosis    Insomnia    Mild mood disorder (CMS HCC)  GAD (generalized anxiety disorder)    Panic disorder (episodic paroxysmal anxiety)    Encephalopathy    Pulmonary edema    Pneumonia    Acute on chronic respiratory failure with hypoxia (CMS HCC)    COPD exacerbation (CMS HCC)        Acute on chronic hypoxic hypercapnic respiratory failure secondary to underlying pneumonia and chronic obstructive pulmonary disease exacerbation  Continue with antibiotics   Duo nebs scheduled and PRN   Steroids   Budesonide  Mucinex  ABG showing worsening hypercapnia, will initiate continuous BiPAP    Metabolic encephalopathy secondary to hypercapnic respiratory failure  BIPAP QHS and while sleeping    Type II NSTEMI vs ACS  Will initiate medical management for MI   Troponin downtrending  Cardiology consulted   Echo pending    Positive blood culture  Suspect contamination   Repeat blood cultures pending    COPD 3L  CAD  HTN    DVT Prophylaxis: Lovenox  Diet: Cardiac  Physical Therapy: Consulted  Disposition: Home    On the day of the encounter, a total of  45 minutes was spent on this patient encounter including review of historical information, examination, documentation and post-visit activities. The time documented excludes procedural time.    Ebbie Ridge, DO

## 2022-10-04 DIAGNOSIS — I129 Hypertensive chronic kidney disease with stage 1 through stage 4 chronic kidney disease, or unspecified chronic kidney disease: Secondary | ICD-10-CM

## 2022-10-04 DIAGNOSIS — N182 Chronic kidney disease, stage 2 (mild): Secondary | ICD-10-CM

## 2022-10-04 LAB — CBC WITH DIFF
BASOPHIL #: 0 10*3/uL (ref 0.00–0.10)
BASOPHIL %: 0 % (ref 0–1)
EOSINOPHIL #: 0 10*3/uL (ref 0.00–0.50)
EOSINOPHIL %: 1 %
HCT: 36.4 % (ref 31.2–41.9)
HGB: 11.8 g/dL (ref 10.9–14.3)
LYMPHOCYTE #: 1.3 10*3/uL (ref 1.00–3.00)
LYMPHOCYTE %: 20 % (ref 16–44)
MCH: 28.5 pg (ref 24.7–32.8)
MCHC: 32.3 g/dL (ref 32.3–35.6)
MCV: 88.1 fL (ref 75.5–95.3)
MONOCYTE #: 0.3 10*3/uL (ref 0.30–1.00)
MONOCYTE %: 5 % (ref 5–13)
MPV: 6.5 fL — ABNORMAL LOW (ref 7.9–10.8)
NEUTROPHIL #: 4.7 10*3/uL (ref 1.85–7.80)
NEUTROPHIL %: 74 % (ref 43–77)
PLATELETS: 212 10*3/uL (ref 140–440)
RBC: 4.14 10*6/uL (ref 3.63–4.92)
RDW: 12.9 % (ref 12.3–17.7)
WBC: 6.4 10*3/uL (ref 3.8–11.8)

## 2022-10-04 LAB — OPIATES CONFIRMATORY/DEFINITIVE, URINE, BY LC-MS/MS (PERFORMABLE)
CODEINE: NOT DETECTED ng/mL (ref <25)
DIHYDROCODEINE: NOT DETECTED ng/mL (ref <25)
HYDROCODONE: NOT DETECTED ng/mL (ref <25)
HYDROMORPHONE: NOT DETECTED ng/mL (ref <25)
MORPHINE: >1000 ng/mL — ABNORMAL HIGH (ref <25)
NORHYDROCODONE: NOT DETECTED ng/mL (ref <25)
NOROXYCODONE: NOT DETECTED ng/mL (ref <25)
OXYCODONE: NOT DETECTED ng/mL (ref <25)
OXYMORPHONE: NOT DETECTED ng/mL (ref <25)

## 2022-10-04 LAB — COMPREHENSIVE METABOLIC PANEL, NON-FASTING
ALBUMIN/GLOBULIN RATIO: 1.3 (ref 0.8–1.4)
ALBUMIN: 3.3 g/dL — ABNORMAL LOW (ref 3.5–5.7)
ALKALINE PHOSPHATASE: 56 U/L (ref 34–104)
ALT (SGPT): 12 U/L (ref 7–52)
ANION GAP: 6 mmol/L (ref 4–13)
AST (SGOT): 11 U/L — ABNORMAL LOW (ref 13–39)
BILIRUBIN TOTAL: 0.9 mg/dL (ref 0.3–1.2)
BUN/CREA RATIO: 36 — ABNORMAL HIGH (ref 6–22)
BUN: 36 mg/dL — ABNORMAL HIGH (ref 7–25)
CALCIUM, CORRECTED: 9.2 mg/dL (ref 8.9–10.8)
CALCIUM: 8.6 mg/dL (ref 8.6–10.3)
CHLORIDE: 91 mmol/L — ABNORMAL LOW (ref 98–107)
CO2 TOTAL: 45 mmol/L (ref 21–31)
CREATININE: 1.01 mg/dL (ref 0.60–1.30)
ESTIMATED GFR: 56 mL/min/{1.73_m2} — ABNORMAL LOW (ref >59)
GLOBULIN: 2.5 — ABNORMAL LOW (ref 2.9–5.4)
GLUCOSE: 101 mg/dL (ref 74–109)
OSMOLALITY, CALCULATED: 292 mOsm/kg — ABNORMAL HIGH (ref 270–290)
POTASSIUM: 3.2 mmol/L — ABNORMAL LOW (ref 3.5–5.1)
PROTEIN TOTAL: 5.8 g/dL — ABNORMAL LOW (ref 6.4–8.9)
SODIUM: 142 mmol/L (ref 136–145)

## 2022-10-04 LAB — ADULT ROUTINE BLOOD CULTURE, SET OF 2 BOTTLES (BACTERIA AND YEAST)
BLOOD CULTURE, ROUTINE: ABNORMAL — CR
BLOOD CULTURE, ROUTINE: NO GROWTH

## 2022-10-04 LAB — ARTERIAL BLOOD GAS/LACTATE
%FIO2 (ARTERIAL): 32 %
BASE EXCESS (ARTERIAL): 25.2 mmol/L — ABNORMAL HIGH (ref 0.0–2.0)
BICARBONATE (ARTERIAL): 45.6 mmol/L — ABNORMAL HIGH (ref 20.0–26.0)
CARBOXYHEMOGLOBIN: 1.5 % (ref <=1.5)
MET-HEMOGLOBIN: 0 % (ref <=2.0)
O2CT: 17.3 %
OXYHEMOGLOBIN: 94.5 % (ref 88.0–100.0)
PCO2 (ARTERIAL): 74 mm/Hg (ref 35–45)
PH (ARTERIAL): 7.43 (ref 7.35–7.45)
PO2 (ARTERIAL): 74 mm/Hg — ABNORMAL LOW (ref 80–100)

## 2022-10-04 LAB — MAGNESIUM: MAGNESIUM: 1.7 mg/dL — ABNORMAL LOW (ref 1.9–2.7)

## 2022-10-04 MED ORDER — MAGNESIUM HYDROXIDE 400 MG/5 ML ORAL SUSPENSION
30.0000 mL | Freq: Four times a day (QID) | ORAL | Status: DC | PRN
Start: 2022-10-04 — End: 2022-10-06
  Filled 2022-10-04: qty 30

## 2022-10-04 MED ORDER — ACETAMINOPHEN 325 MG TABLET
650.0000 mg | ORAL_TABLET | Freq: Four times a day (QID) | ORAL | Status: DC | PRN
Start: 2022-10-04 — End: 2022-10-06
  Administered 2022-10-05: 650 mg via ORAL
  Filled 2022-10-04 (×2): qty 2

## 2022-10-04 MED ORDER — MAGNESIUM SULFATE 1 GRAM/100 ML IN DEXTROSE 5 % INTRAVENOUS PIGGYBACK
1.0000 g | INJECTION | INTRAVENOUS | Status: AC
  Administered 2022-10-04 (×2): 1 g via INTRAVENOUS
  Administered 2022-10-04 (×2): 0 g via INTRAVENOUS
  Filled 2022-10-04: qty 200

## 2022-10-04 MED ORDER — ONDANSETRON HCL (PF) 4 MG/2 ML INJECTION SOLUTION
4.0000 mg | Freq: Four times a day (QID) | INTRAMUSCULAR | Status: DC | PRN
Start: 2022-10-04 — End: 2022-10-06
  Administered 2022-10-04: 4 mg via INTRAVENOUS
  Filled 2022-10-04: qty 2

## 2022-10-04 MED ORDER — POTASSIUM CHLORIDE ER 20 MEQ TABLET,EXTENDED RELEASE(PART/CRYST)
40.0000 meq | ORAL_TABLET | ORAL | Status: AC
Start: 2022-10-04 — End: 2022-10-04
  Administered 2022-10-04: 0 meq via ORAL
  Administered 2022-10-04: 40 meq via ORAL
  Filled 2022-10-04: qty 2

## 2022-10-04 NOTE — Progress Notes (Signed)
10/04/2022  16:51  Michele Fitzgerald  [id number]    History:  82 year old female with past medical history of chronic obstructive pulmonary disease baseline 3 L oxygen at home, history of coronary disease, hypertension, presented to the hospital with acute on chronic hypoxic hypercapnic respiratory failure secondary to chronic obstructive pulmonary disease exacerbation with suspected pneumococcal pneumonia and pulmonary edema.  Patient was being brought into the ER by EMS for worsening shortness of breath and in route to the hospital became altered and lethargic and unresponsive.  CT head was obtained showing no acute process.  Patient's family stated that she was a DNR and they did not want any heroic measures but would like to continue with medical management.  On admission her ABG showed a pH below 7 with pCO2 not calculated, concerning for severe hypercapnic respiratory failure. On 7/7, Per nurses, patient was pretty lethargic and unresponsive for the last couple days, however today is opening her eyes and talking to me.  She is somewhat confused but is agreeable to trying BiPAP.  I think at this point that her confusion was coming from the severe hypercapnic respiratory failure.  Serum bicarbonate is increasing showing the body is trying to compensate for her severe acidosis.  Troponin level was initially mildly elevated on arrival so was repeated and found to be pretty significantly elevated.  She was not complaining of any chest pain during my exam.  It has continued to climb close to 900, will continue to monitor for now.  EKG showed ST changes but appear unchanged compared to previous EKG.  Repeat EKG showed T-wave inversions.  Called and spoke with family who has clarified that they want everything treated aggressively without CPR or intubation.  They would even consider interventional Cardiology if necessary but would like to defer to her as she becomes more oriented. Will order echocardiogram and consult  Cardiology as well as initiate acute coronary syndrome medical management for now.  Patient was found on 07/08 to have worsening hypercapnia, so BiPAP was placed on continuously.    Today:  This morning ABG has improved, however patient is still somewhat lethargic.  Per nursing report, she has been asking for Ativan p.r.n.Marland Kitchen  She normally takes at home, however I do feel that this is making her lethargy worse.  I have discontinued this along with other sedating medications for now.    Past Medical History  FLUoxetine, LORazepam, Potassium Gluconate, albuterol sulfate, ascorbic acid (vitamin C), budesonide-glycopyr-formoterol, clopidogreL, cyanocobalamin, hydroCHLOROthiazide, ipratropium-albuteroL, loratadine, nystatin, predniSONE, rOPINIRole, and rosuvastatin   Allergies   Allergen Reactions    Vistaril [Hydroxyzine Hcl]  Other Adverse Reaction (Add comment)     Causes panic attack and jerking    Aspirin Nausea/ Vomiting    Bactrim [Sulfamethoxazole-Trimethoprim] Nausea/ Vomiting    Codeine  Other Adverse Reaction (Add comment)     Make her lips feel like they are swollen when they are not swollen    Darvon [Propoxyphene] Itching     Past Medical History:   Diagnosis Date    COPD (chronic obstructive pulmonary disease) (CMS HCC)     CVA (cerebrovascular accident) (CMS HCC)     History of MI (myocardial infarction)     HTN (hypertension)          History reviewed. No past surgical history pertinent negatives.      Family Medical History:    None         Social History     Socioeconomic History  Marital status: Widowed   Tobacco Use    Smoking status: Some Days     Current packs/day: 0.50     Types: Cigarettes     Passive exposure: Never    Smokeless tobacco: Never   Vaping Use    Vaping status: Never Used   Substance and Sexual Activity    Alcohol use: Never    Drug use: Never     Social Determinants of Health     Social Connections: Unknown (09/30/2022)    Social Connections     SDOH Social Isolation: Patient chooses  not to answer       Physical exam:  BP (!) 107/55   Pulse 65   Temp 36.2 C (97.2 F)   Resp 18   Ht 1.524 m (5')   Wt 60.4 kg (133 lb 1 oz)   SpO2 96%   BMI 25.99 kg/m       Gen: This is a 82 y.o. female who is awake but lethargic, mild disorientation about situation  CV:  Regular rate and rhythm without murmurs rubs or gallops.  2+ pedal and radial pulses. No clubbing, cyanosis, or edema.  RESP: Diminished bilaterally without rales or rhonchi. Respirations are nonlabored.  GI:  Abdomen is soft, nontender, nondistended.  Bowel sounds normoactive.  MSK:  Full range of motion of upper and lower extremities  NEURO:  Cranial nerves 2-12 grossly intact.  No focal deficits as tested.  SKIN: Warm, dry, intact, without lesions, rashes, or ulcerations    Diagnostic Labs:  Results for orders placed or performed during the hospital encounter of 09/29/22 (from the past 24 hour(s))   CBC/DIFF    Narrative    The following orders were created for panel order CBC/DIFF.  Procedure                               Abnormality         Status                     ---------                               -----------         ------                     CBC WITH GNFA[[id number]]                Abnormal            Final result                 Please view results for these tests on the individual orders.   COMPREHENSIVE METABOLIC PANEL, NON-FASTING   Result Value Ref Range    SODIUM 142 136 - 145 mmol/L    POTASSIUM 3.2 (L) 3.5 - 5.1 mmol/L    CHLORIDE 91 (L) 98 - 107 mmol/L    CO2 TOTAL 45 (HH) 21 - 31 mmol/L    ANION GAP 6 4 - 13 mmol/L    BUN 36 (H) 7 - 25 mg/dL    CREATININE 4.69 6.29 - 1.30 mg/dL    BUN/CREA RATIO 36 (H) 6 - 22    ESTIMATED GFR 56 (L) >59 mL/min/1.36m^2    ALBUMIN 3.3 (L) 3.5 - 5.7 g/dL    CALCIUM 8.6 8.6 - 52.8 mg/dL  GLUCOSE 101 74 - 109 mg/dL    ALKALINE PHOSPHATASE 56 34 - 104 U/L    ALT (SGPT) 12 7 - 52 U/L    AST (SGOT) 11 (L) 13 - 39 U/L    BILIRUBIN TOTAL 0.9 0.3 - 1.2 mg/dL    PROTEIN TOTAL 5.8 (L) 6.4 -  8.9 g/dL    ALBUMIN/GLOBULIN RATIO 1.3 0.8 - 1.4    OSMOLALITY, CALCULATED 292 (H) 270 - 290 mOsm/kg    CALCIUM, CORRECTED 9.2 8.9 - 10.8 mg/dL    GLOBULIN 2.5 (L) 2.9 - 5.4    Narrative    Estimated Glomerular Filtration Rate (eGFR) is calculated using the CKD-EPI (2021) equation, intended for patients 66 years of age and older. If gender is not documented or "unknown", there will be no eGFR calculation.     MAGNESIUM   Result Value Ref Range    MAGNESIUM 1.7 (L) 1.9 - 2.7 mg/dL   CBC WITH DIFF   Result Value Ref Range    WBC 6.4 3.8 - 11.8 x10^3/uL    RBC 4.14 3.63 - 4.92 x10^6/uL    HGB 11.8 10.9 - 14.3 g/dL    HCT 96.0 45.4 - 09.8 %    MCV 88.1 75.5 - 95.3 fL    MCH 28.5 24.7 - 32.8 pg    MCHC 32.3 32.3 - 35.6 g/dL    RDW 11.9 14.7 - 82.9 %    PLATELETS 212 140 - 440 x10^3/uL    MPV 6.5 (L) 7.9 - 10.8 fL    NEUTROPHIL % 74 43 - 77 %    LYMPHOCYTE % 20 16 - 44 %    MONOCYTE % 5 5 - 13 %    EOSINOPHIL % 1 %    BASOPHIL % 0 0 - 1 %    NEUTROPHIL # 4.70 1.85 - 7.80 x10^3/uL    LYMPHOCYTE # 1.30 1.00 - 3.00 x10^3/uL    MONOCYTE # 0.30 0.30 - 1.00 x10^3/uL    EOSINOPHIL # 0.00 0.00 - 0.50 x10^3/uL    BASOPHIL # 0.00 0.00 - 0.10 x10^3/uL   ARTERIAL BLOOD GAS/LACTATE   Result Value Ref Range    PH (ARTERIAL) 7.43 7.35 - 7.45    PCO2 (ARTERIAL) 74 (HH) 35 - 45 mm/Hg    BICARBONATE (ARTERIAL) 45.6 (H) 20.0 - 26.0 mmol/L    BASE EXCESS (ARTERIAL) 25.2 (H) 0.0 - 2.0 mmol/L    MET-HEMOGLOBIN 0.0 <=2.0 %    LACTATE      CARBOXYHEMOGLOBIN 1.5 <=1.5 %    O2CT 17.3 %    %FIO2 (ARTERIAL) 32 %    PO2 (ARTERIAL) 74 (L) 80 - 100 mm/Hg    OXYHEMOGLOBIN 94.5 88.0 - 100.0 %    ALLEN TEST pass     DRAW SITE left radial        Diagnostic Imaging:  CT BRAIN WO IV CONTRAST   Final Result   NO ACUTE FINDINGS         One or more dose reduction techniques were used (e.g., Automated exposure control, adjustment of the mA and/or kV according to patient size, use of iterative reconstruction technique).         Radiologist location ID:  [id number]         XR AP MOBILE CHEST   Final Result   1. SEPTAL THICKENING SUGGESTING INTERSTITIAL EDEMA   2. FOCAL RIGHT UPPER LOBE OPACITY. SUPERIMPOSED INFECTION IS NOT EXCLUDED.  Radiologist location ID: [id number]              Assesment/Plan:  Patient Active Problem List   Diagnosis    Insomnia    Mild mood disorder (CMS HCC)    GAD (generalized anxiety disorder)    Panic disorder (episodic paroxysmal anxiety)    Encephalopathy    Pulmonary edema    Pneumonia    Acute on chronic respiratory failure with hypoxia (CMS HCC)    COPD exacerbation (CMS HCC)        Acute on chronic hypoxic hypercapnic respiratory failure secondary to underlying pneumonia and chronic obstructive pulmonary disease exacerbation  Continue with antibiotics   Duo nebs scheduled and PRN   Steroids   Budesonide  Mucinex    Metabolic encephalopathy secondary to hypercapnic respiratory failure  BIPAP QHS and while sleeping    Type II NSTEMI vs ACS  Continue medical management for coronary disease  Troponin downtrending  Cardiology consulted   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 The time documented excludes procedural time.    Ebbie Ridge, DO

## 2022-10-04 NOTE — Respiratory Therapy (Addendum)
Patient placed on Bipap after abg results obtained.

## 2022-10-04 NOTE — Care Management Notes (Addendum)
Admitted from home. C/O Shortness of Breath. H/O COPD, CVA, MI, HTN.   Dx: Acute on Chronic Hypoxic Hypercapnic Respiratory Failure, Metabolic Encephalopathy due to Hypercapnic RF, Type II NSTEMI vs ACS and (+) Blood Cultures.     Patient currently on BiPap at night, Oxygen infusing at 4 LPM via NC continuous.    On contact this am, patient is alert and able to answer some questions.  She requested I contact her son, Shanley Hauschild at tel: [phone] to which this CM complied.   Patient resides at home alone with multiple family members including son, daughter in law, adult grandson, and other family members living within feet of patient's home. Per son Dimas Aguas, he and patient "share same driveway".  Physical address is [address]/[place].    Son reports use of Oxygen Therapy @ 3 lpm via nc at home supplied by [facility].  No other respiratory therapy reported.   Son reports patient does not use any assistive devices for ambulation inside of home.  He reports patient having a Retail banker for use when leaving the home.    Patient does not have Home Health/Formal Supports. However, son has requested Home Health with [facility] on discharge. CM will initiate order.   Per son, patient is a chronic smoker.  He reports she does not wish to stop smoking and has refused treatment per son.   Patient obtains her Rx meds at [facility] and primary healthcare with [facility].    Patient's son transports. He denies barriers to healthcare, pharmacy and transportation access.    [facility] Home Health referral initiated.   Clinical information uploaded to [facility] and sent to [facility].   No DC barriers identified.     DC Plan to home with family and home health supports.     12:03 PM  HH Order signed.   Sent to [facility] via [employer].

## 2022-10-04 NOTE — Consults (Signed)
[facility]  Palliative Care Nurse Practitioner  Consult Note    Michele Fitzgerald, Michele Fitzgerald, 82 y.o. female  Date of Birth:  Oct 25, 1940  MRN: [id number]  Admit Date: 09/29/2022   Attending: Hospitalist  Michele Gianotti Hill-Hurt, PA-C   Reason for Consult: Goal Coordination    Chief Complaint:  shortness of breath     HPI:  Michele Fitzgerald is a 82 y.o. female who presented with shortness of breath and altered mental status.  She has end-stage chronic obstructive pulmonary disease. Her son states that her lungs have been a lot worse over the last 3 weeks since the increase in heat outside. She went to Dr. Phill Mutter office last Tuesday and was given a steroid shot and a prescription for steroids but over the last couple of days has gotten worse. She was alert and oriented on EMS arrival but declined on the ride.  As soon as she arrived here she became unresponsive.  She was found to be septic with pneumonia and pleural effusions.  Head CT was not performed she has not been stable enough to go over to the department. The patient has been admitted with hypoxic respiratory failure, encephalopathy, severe sepsis, pneumonia, and chronic disease processes.       Subjective:  Resting in bed, eating lunch. Family is present at the bedside to participate in palliative care discussion.     Review of Systems:  ROS: Other than ROS in the HPI, all other systems were negative.    Past Medical History:   Diagnosis Date    COPD (chronic obstructive pulmonary disease) (CMS HCC)     CVA (cerebrovascular accident) (CMS HCC)     History of MI (myocardial infarction)     HTN (hypertension)          History reviewed. No past surgical history pertinent negatives.       Family Medical History:    None         Social Determinants of Psychologist, prison and probation services Strain: No   Transportation Needs: None   Social Connections: Family interaction   Intimate Partner Violence: None   Housing Stability: Has housing     Social History      Socioeconomic History    Marital status: Widowed   Tobacco Use    Smoking status: Some Days     Current packs/day: 0.50     Types: Cigarettes     Passive exposure: Never    Smokeless tobacco: Never   Vaping Use    Vaping status: Never Used   Substance and Sexual Activity    Alcohol use: Never    Drug use: Never     Social Determinants of Health     Social Connections: Unknown (09/30/2022)    Social Connections     SDOH Social Isolation: Patient chooses not to answer       Current Outpatient Medications   Medication Instructions    albuterol sulfate (PROVENTIL OR VENTOLIN OR PROAIR) 90 mcg/actuation Inhalation oral inhaler 1-2 Puffs, Inhalation, EVERY 6 HOURS PRN    ascorbic acid (vitamin C) (VITAMIN C) 500 mg, Oral, DAILY    budesonide-glycopyr-formoterol (BREZTRI AEROSPHERE) 160-9-4.8 mcg/actuation Inhalation HFA Aerosol Inhaler 2 Puffs, Inhalation, 2 TIMES DAILY    clopidogreL (PLAVIX) 75 mg, Oral, DAILY    cyanocobalamin (VITAMIN B 12) 1,000 mcg, Oral, DAILY    FLUoxetine (PROZAC) 20 mg, Oral, DAILY    hydroCHLOROthiazide (HYDRODIURIL) 25 mg, DAILY    IPRATROPIUM 0.5 MG-ALBUTEROL  3 MG (2.5 MG BASE)/3 ML NEBULIZATION SOLN 3 mL, Nebulization, EVERY 6 HOURS PRN    loratadine (CLARITIN) 10 mg, Oral, DAILY    LORazepam (ATIVAN) 1 mg, Oral, 2 TIMES DAILY PRN    nystatin (MYCOSTATIN) 100,000 unit/mL Oral Suspension 5 mL, Oral, 4 TIMES DAILY    Potassium Gluconate 2.5 mEq Oral Tablet 2.5 mEq, Oral, DAILY    predniSONE (DELTASONE) 10 mg, Oral, DAILY    rOPINIRole (REQUIP) 0.5 mg, Oral, 2 TIMES DAILY    rosuvastatin (CRESTOR) 20 mg, Oral, EVERY EVENING      Allergies   Allergen Reactions    Vistaril [Hydroxyzine Hcl]  Other Adverse Reaction (Add comment)     Causes panic attack and jerking    Aspirin Nausea/ Vomiting    Bactrim [Sulfamethoxazole-Trimethoprim] Nausea/ Vomiting    Codeine  Other Adverse Reaction (Add comment)     Make her lips feel like they are swollen when they are not swollen    Darvon [Propoxyphene]  Itching        Physical Exam:  Constitutional: no distress  Respiratory:  Diminished bilaterally   Cardiovascular: S1, S2 normal  Gastrointestinal: non-distended, Soft, non-tender  Extremities: No edema   Integumentary:  Skin warm and dry  Neurologic:  Alert, but intermittently confused     BP (!) 125/59   Pulse 66   Temp 36.6 C (97.9 F)   Resp 20   Ht 1.524 m (5')   Wt 60.4 kg (133 lb 1 oz)   SpO2 97%   BMI 25.99 kg/m        Pain: Numeric 0     Labs:  Lab Results Today:    Results for orders placed or performed during the hospital encounter of 09/29/22 (from the past 24 hour(s))   COMPREHENSIVE METABOLIC PANEL, NON-FASTING   Result Value Ref Range    SODIUM 142 136 - 145 mmol/L    POTASSIUM 3.2 (L) 3.5 - 5.1 mmol/L    CHLORIDE 91 (L) 98 - 107 mmol/L    CO2 TOTAL 45 (HH) 21 - 31 mmol/L    ANION GAP 6 4 - 13 mmol/L    BUN 36 (H) 7 - 25 mg/dL    CREATININE 1.61 0.96 - 1.30 mg/dL    BUN/CREA RATIO 36 (H) 6 - 22    ESTIMATED GFR 56 (L) >59 mL/min/1.19m^2    ALBUMIN 3.3 (L) 3.5 - 5.7 g/dL    CALCIUM 8.6 8.6 - 04.5 mg/dL    GLUCOSE 409 74 - 811 mg/dL    ALKALINE PHOSPHATASE 56 34 - 104 U/L    ALT (SGPT) 12 7 - 52 U/L    AST (SGOT) 11 (L) 13 - 39 U/L    BILIRUBIN TOTAL 0.9 0.3 - 1.2 mg/dL    PROTEIN TOTAL 5.8 (L) 6.4 - 8.9 g/dL    ALBUMIN/GLOBULIN RATIO 1.3 0.8 - 1.4    OSMOLALITY, CALCULATED 292 (H) 270 - 290 mOsm/kg    CALCIUM, CORRECTED 9.2 8.9 - 10.8 mg/dL    GLOBULIN 2.5 (L) 2.9 - 5.4   MAGNESIUM   Result Value Ref Range    MAGNESIUM 1.7 (L) 1.9 - 2.7 mg/dL   CBC WITH DIFF   Result Value Ref Range    WBC 6.4 3.8 - 11.8 x10^3/uL    RBC 4.14 3.63 - 4.92 x10^6/uL    HGB 11.8 10.9 - 14.3 g/dL    HCT 91.4 78.2 - 95.6 %    MCV 88.1 75.5 - 95.3 fL  MCH 28.5 24.7 - 32.8 pg    MCHC 32.3 32.3 - 35.6 g/dL    RDW 47.8 29.5 - 62.1 %    PLATELETS 212 140 - 440 x10^3/uL    MPV 6.5 (L) 7.9 - 10.8 fL    NEUTROPHIL % 74 43 - 77 %    LYMPHOCYTE % 20 16 - 44 %    MONOCYTE % 5 5 - 13 %    EOSINOPHIL % 1 %    BASOPHIL % 0 0 - 1  %    NEUTROPHIL # 4.70 1.85 - 7.80 x10^3/uL    LYMPHOCYTE # 1.30 1.00 - 3.00 x10^3/uL    MONOCYTE # 0.30 0.30 - 1.00 x10^3/uL    EOSINOPHIL # 0.00 0.00 - 0.50 x10^3/uL    BASOPHIL # 0.00 0.00 - 0.10 x10^3/uL   ARTERIAL BLOOD GAS/LACTATE   Result Value Ref Range    PH (ARTERIAL) 7.43 7.35 - 7.45    PCO2 (ARTERIAL) 74 (HH) 35 - 45 mm/Hg    BICARBONATE (ARTERIAL) 45.6 (H) 20.0 - 26.0 mmol/L    BASE EXCESS (ARTERIAL) 25.2 (H) 0.0 - 2.0 mmol/L    MET-HEMOGLOBIN 0.0 <=2.0 %    LACTATE      CARBOXYHEMOGLOBIN 1.5 <=1.5 %    O2CT 17.3 %    %FIO2 (ARTERIAL) 32 %    PO2 (ARTERIAL) 74 (L) 80 - 100 mm/Hg    OXYHEMOGLOBIN 94.5 88.0 - 100.0 %    ALLEN TEST pass     DRAW SITE left radial        Imaging Studies: Images and Reports reviewed to current date.    Care collaborated with the following providers:  Provider notes, labs, and imaging reviewed     ACP/GOC:  Patient resides in her own home, alone. She has several family members within close distance of her home to assist with her needs and care. Her son reports that up until this episode, the patient has been mostly independent with all care and tasks within the home. He has noticed a decrease in her independent abilities within recent weeks as well as worsening symptoms related to COPD. He suspected that it was related to the recent heat locally.   We reviewed current condition and plan of care.    Education on the disease trajectories of chronic health conditions and impact on overall health discussed, patient voiced understanding and had no questions.    Palliative care discussed and the patient/family does wish to receive palliative care on discharge.   Son confirmed that patient had very specific wishes about not having CPR, intubation or any other resuscitative measures including vasopressors.       PPS prior to hospitalization: 70%    PPS currently: 40%      Persons present and participating in discussion: Patient      Was the conversation voluntary? Yes      Plan:  OP  palliative care     Patient Disposition/Discharge needs:  Home    ACP/GOC time:     20 minutes    Total visit time:     38 minutes including ACP/GOC time, Face to face, reviewing medical records, and documentation time.    Thank you for this consult.    Katana Berthold Erlene Quan, NP-C  Palliative Care Nurse Practitioner    This note may have been partially generated using [employer] Fluency Direct system and there may be some incorrect words, spellings, and punctuation that were not noted in checking the note before  saving.

## 2022-10-05 DIAGNOSIS — I517 Cardiomegaly: Secondary | ICD-10-CM

## 2022-10-05 LAB — COMPREHENSIVE METABOLIC PANEL, NON-FASTING
ALBUMIN/GLOBULIN RATIO: 1.4 (ref 0.8–1.4)
ALBUMIN: 3.4 g/dL — ABNORMAL LOW (ref 3.5–5.7)
ALKALINE PHOSPHATASE: 56 U/L (ref 34–104)
ALT (SGPT): 11 U/L (ref 7–52)
ANION GAP: 5 mmol/L (ref 4–13)
AST (SGOT): 12 U/L — ABNORMAL LOW (ref 13–39)
BILIRUBIN TOTAL: 0.6 mg/dL (ref 0.3–1.2)
BUN/CREA RATIO: 36 — ABNORMAL HIGH (ref 6–22)
BUN: 39 mg/dL — ABNORMAL HIGH (ref 7–25)
CALCIUM, CORRECTED: 8.9 mg/dL (ref 8.9–10.8)
CALCIUM: 8.4 mg/dL — ABNORMAL LOW (ref 8.6–10.3)
CHLORIDE: 93 mmol/L — ABNORMAL LOW (ref 98–107)
CO2 TOTAL: 43 mmol/L (ref 21–31)
CREATININE: 1.07 mg/dL (ref 0.60–1.30)
ESTIMATED GFR: 52 mL/min/{1.73_m2} — ABNORMAL LOW (ref >59)
GLOBULIN: 2.5 — ABNORMAL LOW (ref 2.9–5.4)
GLUCOSE: 128 mg/dL — ABNORMAL HIGH (ref 74–109)
OSMOLALITY, CALCULATED: 292 mOsm/kg — ABNORMAL HIGH (ref 270–290)
POTASSIUM: 3.1 mmol/L — ABNORMAL LOW (ref 3.5–5.1)
PROTEIN TOTAL: 5.9 g/dL — ABNORMAL LOW (ref 6.4–8.9)
SODIUM: 141 mmol/L (ref 136–145)

## 2022-10-05 LAB — MAGNESIUM: MAGNESIUM: 2.1 mg/dL (ref 1.9–2.7)

## 2022-10-05 LAB — CBC WITH DIFF
BASOPHIL #: 0 10*3/uL (ref 0.00–0.10)
BASOPHIL %: 0 % (ref 0–1)
EOSINOPHIL #: 0.1 10*3/uL (ref 0.00–0.50)
EOSINOPHIL %: 1 %
HCT: 37.3 % (ref 31.2–41.9)
HGB: 12.3 g/dL (ref 10.9–14.3)
LYMPHOCYTE #: 1.2 10*3/uL (ref 1.00–3.00)
LYMPHOCYTE %: 16 % (ref 16–44)
MCH: 28.8 pg (ref 24.7–32.8)
MCHC: 32.9 g/dL (ref 32.3–35.6)
MCV: 87.7 fL (ref 75.5–95.3)
MONOCYTE #: 0.5 10*3/uL (ref 0.30–1.00)
MONOCYTE %: 7 % (ref 5–13)
MPV: 6.9 fL — ABNORMAL LOW (ref 7.9–10.8)
NEUTROPHIL #: 5.7 10*3/uL (ref 1.85–7.80)
NEUTROPHIL %: 76 % (ref 43–77)
PLATELETS: 213 10*3/uL (ref 140–440)
RBC: 4.26 10*6/uL (ref 3.63–4.92)
RDW: 13.3 % (ref 12.3–17.7)
WBC: 7.5 10*3/uL (ref 3.8–11.8)

## 2022-10-05 LAB — ECG 12 LEAD
Atrial Rate: 129 {beats}/min
Calculated P Axis: 87 degrees
Calculated R Axis: 69 degrees
Calculated T Axis: 79 degrees
PR Interval: 136 ms
QRS Duration: 68 ms
QT Interval: 262 ms
QTC Calculation: 383 ms
Ventricular rate: 129 {beats}/min

## 2022-10-05 MED ORDER — POTASSIUM CHLORIDE ER 20 MEQ TABLET,EXTENDED RELEASE(PART/CRYST)
40.0000 meq | ORAL_TABLET | ORAL | Status: AC
Start: 2022-10-05 — End: 2022-10-05
  Administered 2022-10-05 (×2): 40 meq via ORAL
  Filled 2022-10-05 (×2): qty 2

## 2022-10-05 MED ORDER — LORAZEPAM 1 MG TABLET
1.0000 mg | ORAL_TABLET | ORAL | Status: AC
Start: 2022-10-05 — End: 2022-10-05
  Administered 2022-10-05: 1 mg via ORAL
  Filled 2022-10-05: qty 1

## 2022-10-05 NOTE — Progress Notes (Addendum)
10/05/2022  10:28  Michele Fitzgerald  [id number]    History:  82 year old female with past medical history of chronic obstructive pulmonary disease baseline 3 L oxygen at home, history of coronary disease, hypertension, presented to the hospital with acute on chronic hypoxic hypercapnic respiratory failure secondary to chronic obstructive pulmonary disease exacerbation with suspected pneumococcal pneumonia and pulmonary edema.  Patient was being brought into the ER by EMS for worsening shortness of breath and in route to the hospital became altered and lethargic and unresponsive.  CT head was obtained showing no acute process.  Patient's family stated that she was a DNR and they did not want any heroic measures but would like to continue with medical management.  On admission her ABG showed a pH below 7 with pCO2 not calculated, concerning for severe hypercapnic respiratory failure. On 7/7, Per nurses, patient was pretty lethargic and unresponsive for the last couple days, however today is opening her eyes and talking to me.  She is somewhat confused but is agreeable to trying BiPAP.  I think at this point that her confusion was coming from the severe hypercapnic respiratory failure.  Serum bicarbonate is increasing showing the body is trying to compensate for her severe acidosis.  Troponin level was initially mildly elevated on arrival so was repeated and found to be pretty significantly elevated.  She was not complaining of any chest pain during my exam.  It has continued to climb close to 900, will continue to monitor for now.  EKG showed ST changes but appear unchanged compared to previous EKG.  Repeat EKG showed T-wave inversions.  Called and spoke with family who has clarified that they want everything treated aggressively without CPR or intubation.  They would even consider interventional Cardiology if necessary but would like to defer to her as she becomes more oriented. Will order echocardiogram and consult  Cardiology as well as initiate acute coronary syndrome medical management for now.  Patient was found on 07/08 to have worsening hypercapnia, so BiPAP was placed on continuously.  On 07/09 ABG had improved.  Patient was still somewhat lethargic so multiple medications which could cause lethargy were held.    Today:  Seems to be doing much better.  Patient will need BiPAP arranged upon discharge, also plans for rehabilitation need to be made as well.    Past Medical History  FLUoxetine, LORazepam, Potassium Gluconate, albuterol sulfate, ascorbic acid (vitamin C), budesonide-glycopyr-formoterol, clopidogreL, cyanocobalamin, hydroCHLOROthiazide, ipratropium-albuteroL, loratadine, nystatin, predniSONE, rOPINIRole, and rosuvastatin   Allergies   Allergen Reactions    Vistaril [Hydroxyzine Hcl]  Other Adverse Reaction (Add comment)     Causes panic attack and jerking    Aspirin Nausea/ Vomiting    Bactrim [Sulfamethoxazole-Trimethoprim] Nausea/ Vomiting    Codeine  Other Adverse Reaction (Add comment)     Make her lips feel like they are swollen when they are not swollen    Darvon [Propoxyphene] Itching     Past Medical History:   Diagnosis Date    COPD (chronic obstructive pulmonary disease) (CMS HCC)     CVA (cerebrovascular accident) (CMS HCC)     History of MI (myocardial infarction)     HTN (hypertension)          History reviewed. No past surgical history pertinent negatives.      Family Medical History:    None         Social History     Socioeconomic History    Marital status: Widowed  Tobacco Use    Smoking status: Some Days     Current packs/day: 0.50     Types: Cigarettes     Passive exposure: Never    Smokeless tobacco: Never   Vaping Use    Vaping status: Never Used   Substance and Sexual Activity    Alcohol use: Never    Drug use: Never     Social Determinants of Health     Social Connections: Unknown (09/30/2022)    Social Connections     SDOH Social Isolation: Patient chooses not to answer       Physical  exam:  BP 136/62   Pulse 95   Temp 36.4 C (97.6 F)   Resp 20   Ht 1.524 m (5')   Wt 60.4 kg (133 lb 1 oz)   SpO2 90%   BMI 25.99 kg/m       Gen: This is a 82 y.o. female who is awake but lethargic, mild disorientation about situation  CV:  Regular rate and rhythm without murmurs rubs or gallops.  2+ pedal and radial pulses. No clubbing, cyanosis, or edema.  RESP: Diminished bilaterally without rales or rhonchi. Respirations are nonlabored.  GI:  Abdomen is soft, nontender, nondistended.  Bowel sounds normoactive.  MSK:  Full range of motion of upper and lower extremities  NEURO:  Cranial nerves 2-12 grossly intact.  No focal deficits as tested.  SKIN: Warm, dry, intact, without lesions, rashes, or ulcerations    Diagnostic Labs:  Results for orders placed or performed during the hospital encounter of 09/29/22 (from the past 24 hour(s))   CBC/DIFF    Narrative    The following orders were created for panel order CBC/DIFF.  Procedure                               Abnormality         Status                     ---------                               -----------         ------                     CBC WITH DIFF[[id number]]                Abnormal            Final result                 Please view results for these tests on the individual orders.   COMPREHENSIVE METABOLIC PANEL, NON-FASTING   Result Value Ref Range    SODIUM 141 136 - 145 mmol/L    POTASSIUM 3.1 (L) 3.5 - 5.1 mmol/L    CHLORIDE 93 (L) 98 - 107 mmol/L    CO2 TOTAL 43 (HH) 21 - 31 mmol/L    ANION GAP 5 4 - 13 mmol/L    BUN 39 (H) 7 - 25 mg/dL    CREATININE 1.61 0.96 - 1.30 mg/dL    BUN/CREA RATIO 36 (H) 6 - 22    ESTIMATED GFR 52 (L) >59 mL/min/1.44m^2    ALBUMIN 3.4 (L) 3.5 - 5.7 g/dL    CALCIUM 8.4 (L) 8.6 - 10.3 mg/dL    GLUCOSE 045 (  H) 74 - 109 mg/dL    ALKALINE PHOSPHATASE 56 34 - 104 U/L    ALT (SGPT) 11 7 - 52 U/L    AST (SGOT) 12 (L) 13 - 39 U/L    BILIRUBIN TOTAL 0.6 0.3 - 1.2 mg/dL    PROTEIN TOTAL 5.9 (L) 6.4 - 8.9 g/dL     ALBUMIN/GLOBULIN RATIO 1.4 0.8 - 1.4    OSMOLALITY, CALCULATED 292 (H) 270 - 290 mOsm/kg    CALCIUM, CORRECTED 8.9 8.9 - 10.8 mg/dL    GLOBULIN 2.5 (L) 2.9 - 5.4    Narrative    Estimated Glomerular Filtration Rate (eGFR) is calculated using the CKD-EPI (2021) equation, intended for patients 17 years of age and older. If gender is not documented or "unknown", there will be no eGFR calculation.     MAGNESIUM   Result Value Ref Range    MAGNESIUM 2.1 1.9 - 2.7 mg/dL   CBC WITH DIFF   Result Value Ref Range    WBC 7.5 3.8 - 11.8 x10^3/uL    RBC 4.26 3.63 - 4.92 x10^6/uL    HGB 12.3 10.9 - 14.3 g/dL    HCT 16.1 09.6 - 04.5 %    MCV 87.7 75.5 - 95.3 fL    MCH 28.8 24.7 - 32.8 pg    MCHC 32.9 32.3 - 35.6 g/dL    RDW 40.9 81.1 - 91.4 %    PLATELETS 213 140 - 440 x10^3/uL    MPV 6.9 (L) 7.9 - 10.8 fL    NEUTROPHIL % 76 43 - 77 %    LYMPHOCYTE % 16 16 - 44 %    MONOCYTE % 7 5 - 13 %    EOSINOPHIL % 1 %    BASOPHIL % 0 0 - 1 %    NEUTROPHIL # 5.70 1.85 - 7.80 x10^3/uL    LYMPHOCYTE # 1.20 1.00 - 3.00 x10^3/uL    MONOCYTE # 0.50 0.30 - 1.00 x10^3/uL    EOSINOPHIL # 0.10 0.00 - 0.50 x10^3/uL    BASOPHIL # 0.00 0.00 - 0.10 x10^3/uL       Diagnostic Imaging:  CT BRAIN WO IV CONTRAST   Final Result   NO ACUTE FINDINGS         One or more dose reduction techniques were used (e.g., Automated exposure control, adjustment of the mA and/or kV according to patient size, use of iterative reconstruction technique).         Radiologist location ID: [id number]         XR AP MOBILE CHEST   Final Result   1. SEPTAL THICKENING SUGGESTING INTERSTITIAL EDEMA   2. FOCAL RIGHT UPPER LOBE OPACITY. SUPERIMPOSED INFECTION IS NOT EXCLUDED.            Radiologist location ID: [id number]              Assesment/Plan:  Patient Active Problem List   Diagnosis    Insomnia    Mild mood disorder (CMS HCC)    GAD (generalized anxiety disorder)    Panic disorder (episodic paroxysmal anxiety)    Encephalopathy    Pulmonary edema    Pneumonia    Acute on  chronic respiratory failure with hypoxia (CMS HCC)    COPD exacerbation (CMS HCC)        Acute on chronic hypoxic hypercapnic respiratory failure secondary to underlying pneumonia and chronic obstructive pulmonary disease exacerbation  Continue with antibiotics   Duo nebs scheduled and PRN   Steroids  Budesonide  Mucinex  Due to patients chronic respiratory failure, secondary to COPD, patient requires frequent durations of respiratory support and deteriorates quickly in the absence of non-invasive mechanical ventilator.  BiPAP has been considered and ruled out as volume requirements are not met by bi--level pap devices.  Interruption or failure to provider Non-Invasive Mechanical Ventilator would quickly lead to exacerbation of patient's condition, hospital admission and likely harm to the patient.  Continued use is preferred at night and during day as needed.      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 The time documented excludes procedural time.    Ebbie Ridge, DO

## 2022-10-05 NOTE — Care Management Notes (Signed)
Admitted from home.  Dx: Acute on Chronic Hypoxic Hypercapnic Respiratory Failure secondary to underlying Pneumonia and COPD Exacerbation, Metabolic Encephalopathy secondary to Hypercapnic Respiratory Failure, Type II NSTEMI vs ACS.    Patient resides alone however, next door to multiple family members who assist with care needs daily.    Referral was made to [facility] to which this Agency accepted referral for admission on discharge.     DC Plan to home with family and HH support provided by [facility] when clinically stable for discharge.

## 2022-10-05 NOTE — Respiratory Therapy (Signed)
10/05/22 2245   BiPAP/CPAP Assessment   Start Time 2245   Bipap Status Placed On Bipap   Oxygen Concentration (%) 40   Circuit Checked   Proximal  Temperature   (off per patient)   H2O Bag Checked   Hepa Filter Checked   BiPAP/CPAP Check   Type of Mask FFM   NG/OG Placed No   Facial Skin Integrity Abrasion   Device V-60    Mode S/T   Spontaneous Volume 714 mL   Set Rate 20   Spontaneous Rate 3 Breaths Per Minute   Minute Volume 16.6 LPM   FiO2 Set 40%   IPAP 18 cmH20   EPAP 8 cmH2O   PIP 20 cmH2O   Rise Time % 2   I-Time 0.85 seconds   SpO2 97 %   Alarms   Audible Alarms Checked & Functioning   Hi Rate 40   Lo Rate 5   Hi Pressure 40 cmH2O   Lo Pressure 5 cmH2O   Low Volume 200 m L   Apnea Alarm 20 Seconds   Low Min Ventilation 2   Timepoint   Stop Time 2251   Check Duration 6 minutes       Patient placed on BiPAP.

## 2022-10-05 NOTE — Nurses Notes (Signed)
Pt took off BiPAP,  pt refusing to put back on. Pt educated on importance of wearing BiPAP. Pt placed on 3L NC.

## 2022-10-06 DIAGNOSIS — F17200 Nicotine dependence, unspecified, uncomplicated: Secondary | ICD-10-CM

## 2022-10-06 DIAGNOSIS — J9612 Chronic respiratory failure with hypercapnia: Secondary | ICD-10-CM

## 2022-10-06 LAB — CBC WITH DIFF
BASOPHIL #: 0 10*3/uL (ref 0.00–0.10)
BASOPHIL %: 0 % (ref 0–1)
EOSINOPHIL #: 0.1 10*3/uL (ref 0.00–0.50)
EOSINOPHIL %: 1 %
HCT: 35.7 % (ref 31.2–41.9)
HGB: 11.6 g/dL (ref 10.9–14.3)
LYMPHOCYTE #: 1.4 10*3/uL (ref 1.00–3.00)
LYMPHOCYTE %: 18 % (ref 16–44)
MCH: 28.8 pg (ref 24.7–32.8)
MCHC: 32.7 g/dL (ref 32.3–35.6)
MCV: 88.3 fL (ref 75.5–95.3)
MONOCYTE #: 0.6 10*3/uL (ref 0.30–1.00)
MONOCYTE %: 7 % (ref 5–13)
MPV: 6.8 fL — ABNORMAL LOW (ref 7.9–10.8)
NEUTROPHIL #: 6.2 10*3/uL (ref 1.85–7.80)
NEUTROPHIL %: 74 % (ref 43–77)
PLATELETS: 202 10*3/uL (ref 140–440)
RBC: 4.04 10*6/uL (ref 3.63–4.92)
RDW: 13.3 % (ref 12.3–17.7)
WBC: 8.3 10*3/uL (ref 3.8–11.8)

## 2022-10-06 LAB — COMPREHENSIVE METABOLIC PANEL, NON-FASTING
ALBUMIN/GLOBULIN RATIO: 1.4 (ref 0.8–1.4)
ALBUMIN: 3.3 g/dL — ABNORMAL LOW (ref 3.5–5.7)
ALKALINE PHOSPHATASE: 54 U/L (ref 34–104)
ALT (SGPT): 12 U/L (ref 7–52)
ANION GAP: 3 mmol/L — ABNORMAL LOW (ref 4–13)
AST (SGOT): 13 U/L (ref 13–39)
BILIRUBIN TOTAL: 0.5 mg/dL (ref 0.3–1.2)
BUN/CREA RATIO: 31 — ABNORMAL HIGH (ref 6–22)
BUN: 28 mg/dL — ABNORMAL HIGH (ref 7–25)
CALCIUM, CORRECTED: 8.9 mg/dL (ref 8.9–10.8)
CALCIUM: 8.3 mg/dL — ABNORMAL LOW (ref 8.6–10.3)
CHLORIDE: 99 mmol/L (ref 98–107)
CO2 TOTAL: 41 mmol/L (ref 21–31)
CREATININE: 0.89 mg/dL (ref 0.60–1.30)
ESTIMATED GFR: 65 mL/min/{1.73_m2} (ref >59)
GLOBULIN: 2.3 — ABNORMAL LOW (ref 2.9–5.4)
GLUCOSE: 170 mg/dL — ABNORMAL HIGH (ref 74–109)
OSMOLALITY, CALCULATED: 295 mOsm/kg — ABNORMAL HIGH (ref 270–290)
POTASSIUM: 3.6 mmol/L (ref 3.5–5.1)
PROTEIN TOTAL: 5.6 g/dL — ABNORMAL LOW (ref 6.4–8.9)
SODIUM: 143 mmol/L (ref 136–145)

## 2022-10-06 LAB — MAGNESIUM: MAGNESIUM: 1.9 mg/dL (ref 1.9–2.7)

## 2022-10-06 LAB — ADULT ROUTINE BLOOD CULTURE, SET OF 2 BOTTLES (BACTERIA AND YEAST)
BLOOD CULTURE, ROUTINE: NO GROWTH
BLOOD CULTURE, ROUTINE: NO GROWTH

## 2022-10-06 MED ORDER — ASPIRIN 81 MG TABLET,DELAYED RELEASE
81.0000 mg | DELAYED_RELEASE_TABLET | Freq: Every day | ORAL | Status: AC
Start: 2022-10-06 — End: ?

## 2022-10-06 MED ORDER — METOPROLOL TARTRATE 25 MG TABLET
12.5000 mg | ORAL_TABLET | Freq: Two times a day (BID) | ORAL | 0 refills | Status: DC
Start: 2022-10-06 — End: 2022-12-02

## 2022-10-06 NOTE — Discharge Summary (Addendum)
[facility]  DISCHARGE SUMMARY    PATIENT NAME:  Michele Fitzgerald, Michele Fitzgerald  MRN:  [id number]  DOB:  25-Oct-1940    ENCOUNTER DATE:  09/29/2022  INPATIENT ADMISSION DATE: 09/29/2022  DISCHARGE DATE:  10/06/2022    ATTENDING PHYSICIAN: Ebbie Ridge, DO  SERVICE: [facility] HOSPITALIST 4  PRIMARY CARE PHYSICIAN: Myra Gianotti Hill-Hurt, PA-C       No lay caregiver identified.    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] 10/06/2022    Tobacco use disorder [F17.200] 10/06/2022      Resolved Hospital Problems    Diagnosis     Pulmonary edema [J81.1]     Pneumonia [J18.9]     Acute on chronic respiratory failure with hypoxia (CMS HCC) [J96.21]     COPD exacerbation (CMS HCC) [J44.1]     Encephalopathy [G93.40]      Active Non-Hospital Problems    Diagnosis Date Noted    Insomnia 05/07/2021    Mild mood disorder (CMS HCC) 05/07/2021    GAD (generalized anxiety disorder) 05/07/2021    Panic disorder (episodic paroxysmal anxiety) 05/07/2021           Current Discharge Medication List        START taking these medications.        Details   aspirin 81 mg Tablet, Delayed Release (E.C.)  Commonly known as: ECOTRIN   81 mg, Oral, DAILY  Refills: 0     metoprolol tartrate 25 mg Tablet  Commonly known as: LOPRESSOR   12.5 mg, Oral, 2 TIMES DAILY  Qty: 30 Tablet  Refills: 0            CONTINUE these medications - NO CHANGES were made during your visit.        Details   albuterol sulfate 90 mcg/actuation oral inhaler  Commonly known as: PROVENTIL or VENTOLIN or PROAIR   1-2 Puffs, Inhalation, EVERY 6 HOURS PRN  Refills: 0     ascorbic acid (vitamin C) 500 mg Tablet  Commonly known as: VITAMIN C   500 mg, Oral, DAILY  Refills: 0     budesonide-glycopyr-formoterol 160-9-4.8 mcg/actuation HFA Aerosol Inhaler  Commonly known as: BREZTRI AEROSPHERE   2 Puffs, Inhalation, 2 TIMES DAILY  Refills: 0     clopidogreL 75 mg Tablet  Commonly known as:  PLAVIX   75 mg, Oral, DAILY  Refills: 0     cyanocobalamin 1,000 mcg Tablet  Commonly known as: VITAMIN B 12   1,000 mcg, Oral, DAILY  Refills: 0     FLUoxetine 20 mg Capsule  Commonly known as: PROzac   20 mg, Oral, DAILY  Qty: 30 Capsule  Refills: 2     ipratropium-albuteroL 0.5 mg-3 mg(2.5 mg base)/3 mL nebulizer solution  Commonly known as: DUONEB   3 mL, Nebulization, EVERY 6 HOURS PRN  Refills: 0     loratadine 10 mg Tablet  Commonly known as: CLARITIN   10 mg, Oral, DAILY  Refills: 0     nystatin 100,000 unit/mL Suspension  Commonly known as: MYCOSTATIN   5 mL, Oral, 4 TIMES DAILY  Refills: 0     Potassium Gluconate 2.5 mEq Tablet   2.5 mEq, Oral, DAILY  Refills: 0     predniSONE 10 mg Tablet  Commonly known as: DELTASONE   10 mg, Oral, DAILY  Refills: 0     rOPINIRole 0.5 mg  Tablet  Commonly known as: REQUIP   0.5 mg, Oral, 2 TIMES DAILY  Qty: 60 Tablet  Refills: 3     rosuvastatin 20 mg Tablet  Commonly known as: CRESTOR   20 mg, Oral, EVERY EVENING  Refills: 0            STOP taking these medications.      hydroCHLOROthiazide 25 mg Tablet  Commonly known as: HYDRODIURIL     LORazepam 1 mg Tablet  Commonly known as: ATIVAN            Discharge med list refreshed?  YES     Allergies   Allergen Reactions    Vistaril [Hydroxyzine Hcl]  Other Adverse Reaction (Add comment)     Causes panic attack and jerking    Aspirin Nausea/ Vomiting    Bactrim [Sulfamethoxazole-Trimethoprim] Nausea/ Vomiting    Codeine  Other Adverse Reaction (Add comment)     Make her lips feel like they are swollen when they are not swollen    Darvon [Propoxyphene] Itching     HOSPITAL PROCEDURE(S):   No orders of the defined types were placed in this encounter.      REASON FOR HOSPITALIZATION AND HOSPITAL COURSE   BRIEF HPI:  This is a 82 year old female with past medical history of chronic obstructive pulmonary disease baseline 3 L oxygen at home, history of coronary disease, hypertension, presented to the hospital with acute on chronic  hypoxic hypercapnic respiratory failure secondary to chronic obstructive pulmonary disease exacerbation with suspected pneumococcal pneumonia and pulmonary edema.  Patient was being brought into the ER by EMS for worsening shortness of breath and in route to the hospital became altered and lethargic and unresponsive.  CT head was obtained showing no acute process.  Patient's family stated that she was a DNR and they did not want any heroic measures but would like to continue with medical management.  On admission her ABG showed a pH below 7 with pCO2 not calculated, concerning for severe hypercapnic respiratory failure. On 7/7, Per nurses, patient was pretty lethargic and unresponsive for the last couple days, however today is opening her eyes and talking to me.  She is somewhat confused but is agreeable to trying BiPAP.  I think at this point that her confusion was coming from the severe hypercapnic respiratory failure.  Serum bicarbonate is increasing showing the body is trying to compensate for her severe acidosis.  Troponin level was initially mildly elevated on arrival so was repeated and found to be pretty significantly elevated.  She was not complaining of any chest pain during my exam.  It has continued to climb close to 900, will continue to monitor for now.  EKG showed ST changes but appear unchanged compared to previous EKG.  Repeat EKG showed T-wave inversions.  Called and spoke with family who has clarified that they want everything treated aggressively without CPR or intubation.  They would even consider interventional Cardiology if necessary but would like to defer to her as she becomes more oriented. Will order echocardiogram and consult Cardiology as well as initiate acute coronary syndrome medical management for now.  Patient was found on 07/08 to have worsening hypercapnia, so BiPAP was placed on continuously.  On 07/09 ABG had improved.  Patient was still somewhat lethargic so multiple  medications which could cause lethargy were held.  She has started to improve since then.  Mentation is much more improved.  Lethargy has improved.  She still has significant CO2 retention  and has compensated so far.  Attempting to get trilogy arranged on outpatient basis.  Can go home with home health.      Will be discharged on metoprolol and aspirin combined with statin therapy for treatment of her suspected underlying coronary disease.  Will need close follow-up with PCP, Cardiology, and pulmonology.  Will discontinue Ativan as severely contributes to her lethargy.  She only takes it on a p.r.n. basis so no significant risk of withdrawal.    Physical exam:  BP (!) 124/49   Pulse 70   Temp 36.8 C (98.2 F)   Resp 19   Ht 1.524 m (5')   Wt 59 kg (130 lb 1.6 oz)   SpO2 100%   BMI 25.41 kg/m     Gen: This is a 82 y.o. female who is awake and alert, oriented x3  CV:  Regular rate and rhythm without murmurs rubs or gallops.  2+ pedal and radial pulses. No clubbing, cyanosis, or edema.  RESP: Diminished bilaterally without rales or rhonchi. Respirations are nonlabored.  GI:  Abdomen is soft, nontender, nondistended.  Bowel sounds normoactive.  MSK:  Full range of motion of upper and lower extremities  NEURO:  Cranial nerves 2-12 grossly intact.  No focal deficits as tested.  SKIN: Warm, dry, intact, without lesions, rashes, or ulcerations      CONDITION ON DISCHARGE:  A. Ambulation: Ambulation with assistive device  B. Self-care Ability: With partial assistance  C. Cognitive Status Alert and Oriented x 3  D. Code status at discharge:       LINES/DRAINS/WOUNDS AT DISCHARGE:   Patient Lines/Drains/Airways Status       Active Line / Dialysis Catheter / Dialysis Graft / Drain / Airway / Wound       Name Placement date Placement time Site Days    Peripheral IV Left Basilic  (medial side of arm) 10/04/22  0031  -- 2                    DISCHARGE DISPOSITION:  Home discharge and Home Health  DISCHARGE  INSTRUCTIONS:  Post-Discharge Follow Up Appointments       Follow up with Hill-Hurt, Myra Gianotti, PA-C in 1 week(s)    Phone: [phone]    Where: [address] [place] [address]    Follow up with Renaye Rakers, MD in 1 week(s)    Phone: [phone]    Where: [facility]             Refer to Home Health - EXTERNAL          Ebbie Ridge, DO    Copies sent to Care Team         Relationship Specialty Notifications Start End    Hill-Hurt, Myra Gianotti, [place] PCP - General PHYSICIAN ASSISTANT  05/18/21     Phone: [phone] Fax: [phone]         [address]  [place] [address]            Referring providers can utilize [email or web address] to access their referred [facility] patient's information.              Please add sepsis secondary to PNA

## 2022-10-06 NOTE — Care Management Notes (Signed)
Trilogy approved.   Respiratory Therapy enroute to set up Trilogy prior to discharge per Thomasenia Sales, [facility].

## 2022-10-06 NOTE — Respiratory Therapy (Signed)
Smoking cessation education offered for pt.  Denied wanting help. Pamphlet left with patient

## 2022-10-06 NOTE — Nurses Notes (Signed)
AVS teaching done with Rozell and son who is present at bedside. Teaching done on follow up appointments, prescriptions to pick up, medications, reasons to contact MD or call 911. Trilogy therapist came to deliver and set up machine, patient took machine with her as well as all belongings, son brought home oxygen tank for transport, wheelchair to lobby by staff.

## 2022-10-07 ENCOUNTER — Telehealth (HOSPITAL_COMMUNITY): Payer: Self-pay

## 2022-10-09 DIAGNOSIS — I21A1 Myocardial infarction type 2: Secondary | ICD-10-CM

## 2022-10-11 NOTE — Care Management Notes (Signed)
 Referral Information  ++++++ Placed Provider #1 ++++++  Case Manager: Freddrick March  Provider Type: Home Health  Provider Name: [facility] [phone])  Address:  [address]  [place]  [place], [place] [id number]  Contact: Casimer Bilis    Phone: [phone] x  Fax:   Fax: [phone]

## 2022-10-11 NOTE — Care Management Notes (Signed)
Referral Information  ++++++ Placed Provider #1 ++++++  Case Manager: Robin Pruitt  Provider Type: DME  Provider Name: [facility]  Address:  [address]  [place], [place] [id number]  Contact: [place] [facility]    Phone: [phone] x  Fax:   Fax: [phone]

## 2022-10-14 ENCOUNTER — Other Ambulatory Visit (HOSPITAL_COMMUNITY): Payer: Self-pay | Admitting: Family Medicine

## 2022-10-14 ENCOUNTER — Other Ambulatory Visit: Payer: Self-pay

## 2022-10-14 ENCOUNTER — Inpatient Hospital Stay
Admission: RE | Admit: 2022-10-14 | Discharge: 2022-10-14 | Disposition: A | Discharge status: Home / Self Care | Payer: Medicare Other | Source: Ambulatory Visit | Attending: Family Medicine | Admitting: Family Medicine

## 2022-10-14 DIAGNOSIS — J449 Chronic obstructive pulmonary disease, unspecified: Secondary | ICD-10-CM | POA: Insufficient documentation

## 2022-11-25 ENCOUNTER — Encounter (HOSPITAL_COMMUNITY): Payer: Self-pay

## 2022-11-25 ENCOUNTER — Emergency Department (EMERGENCY_DEPARTMENT_HOSPITAL)
Admission: EM | Admit: 2022-11-25 | Discharge: 2022-11-25 | Disposition: A | Discharge status: Home / Self Care | Payer: Medicare Other | Source: Home / Self Care | Attending: Physician Assistant | Admitting: Physician Assistant

## 2022-11-25 ENCOUNTER — Other Ambulatory Visit: Payer: Self-pay

## 2022-11-25 ENCOUNTER — Emergency Department (HOSPITAL_COMMUNITY): Payer: Medicare Other

## 2022-11-25 DIAGNOSIS — Z9981 Dependence on supplemental oxygen: Secondary | ICD-10-CM | POA: Insufficient documentation

## 2022-11-25 DIAGNOSIS — I5032 Chronic diastolic (congestive) heart failure: Secondary | ICD-10-CM | POA: Insufficient documentation

## 2022-11-25 DIAGNOSIS — E876 Hypokalemia: Secondary | ICD-10-CM | POA: Diagnosis present

## 2022-11-25 DIAGNOSIS — Z1152 Encounter for screening for COVID-19: Secondary | ICD-10-CM | POA: Insufficient documentation

## 2022-11-25 DIAGNOSIS — J9611 Chronic respiratory failure with hypoxia: Secondary | ICD-10-CM | POA: Diagnosis present

## 2022-11-25 DIAGNOSIS — F17210 Nicotine dependence, cigarettes, uncomplicated: Secondary | ICD-10-CM | POA: Diagnosis present

## 2022-11-25 DIAGNOSIS — Z66 Do not resuscitate: Secondary | ICD-10-CM | POA: Diagnosis present

## 2022-11-25 DIAGNOSIS — J441 Chronic obstructive pulmonary disease with (acute) exacerbation: Secondary | ICD-10-CM | POA: Insufficient documentation

## 2022-11-25 DIAGNOSIS — K567 Ileus, unspecified: Secondary | ICD-10-CM | POA: Diagnosis present

## 2022-11-25 DIAGNOSIS — G2581 Restless legs syndrome: Secondary | ICD-10-CM | POA: Diagnosis present

## 2022-11-25 DIAGNOSIS — F411 Generalized anxiety disorder: Secondary | ICD-10-CM | POA: Diagnosis present

## 2022-11-25 DIAGNOSIS — Z7982 Long term (current) use of aspirin: Secondary | ICD-10-CM

## 2022-11-25 DIAGNOSIS — E871 Hypo-osmolality and hyponatremia: Secondary | ICD-10-CM | POA: Diagnosis present

## 2022-11-25 DIAGNOSIS — Z79899 Other long term (current) drug therapy: Secondary | ICD-10-CM

## 2022-11-25 DIAGNOSIS — E785 Hyperlipidemia, unspecified: Secondary | ICD-10-CM | POA: Diagnosis present

## 2022-11-25 DIAGNOSIS — I498 Other specified cardiac arrhythmias: Secondary | ICD-10-CM | POA: Insufficient documentation

## 2022-11-25 DIAGNOSIS — Z20822 Contact with and (suspected) exposure to covid-19: Secondary | ICD-10-CM | POA: Diagnosis present

## 2022-11-25 DIAGNOSIS — I110 Hypertensive heart disease with heart failure: Secondary | ICD-10-CM | POA: Diagnosis present

## 2022-11-25 DIAGNOSIS — I252 Old myocardial infarction: Secondary | ICD-10-CM

## 2022-11-25 DIAGNOSIS — E538 Deficiency of other specified B group vitamins: Secondary | ICD-10-CM | POA: Diagnosis present

## 2022-11-25 DIAGNOSIS — Z8673 Personal history of transient ischemic attack (TIA), and cerebral infarction without residual deficits: Secondary | ICD-10-CM

## 2022-11-25 LAB — CBC WITH DIFF
BASOPHIL #: 0 10*3/uL (ref 0.00–0.10)
BASOPHIL %: 1 % (ref 0–1)
EOSINOPHIL #: 0.1 10*3/uL (ref 0.00–0.50)
EOSINOPHIL %: 2 % (ref 1–7)
HCT: 30.9 % — ABNORMAL LOW (ref 31.2–41.9)
HGB: 10.5 g/dL — ABNORMAL LOW (ref 10.9–14.3)
LYMPHOCYTE #: 1.4 10*3/uL (ref 1.00–3.00)
LYMPHOCYTE %: 22 % (ref 16–44)
MCH: 29.3 pg (ref 24.7–32.8)
MCHC: 33.9 g/dL (ref 32.3–35.6)
MCV: 86.3 fL (ref 75.5–95.3)
MONOCYTE #: 0.4 10*3/uL (ref 0.30–1.00)
MONOCYTE %: 7 % (ref 5–13)
MPV: 5.9 fL — ABNORMAL LOW (ref 7.9–10.8)
NEUTROPHIL #: 4.6 10*3/uL (ref 1.85–7.80)
NEUTROPHIL %: 69 % (ref 43–77)
PLATELETS: 320 10*3/uL (ref 140–440)
RBC: 3.58 10*6/uL — ABNORMAL LOW (ref 3.63–4.92)
RDW: 14.1 % (ref 12.3–17.7)
WBC: 6.7 10*3/uL (ref 3.8–11.8)

## 2022-11-25 LAB — B-TYPE NATRIURETIC PEPTIDE (BNP),PLASMA: BNP: 81 pg/mL (ref 1–100)

## 2022-11-25 LAB — URINALYSIS, MACROSCOPIC
BILIRUBIN: NEGATIVE mg/dL
BLOOD: NEGATIVE mg/dL
GLUCOSE: NEGATIVE mg/dL
KETONES: NEGATIVE mg/dL
LEUKOCYTES: NEGATIVE WBCs/uL
NITRITE: NEGATIVE
PH: 7.5 (ref 5.0–9.0)
PROTEIN: NEGATIVE mg/dL
SPECIFIC GRAVITY: 1.01 (ref 1.002–1.030)
UROBILINOGEN: NORMAL mg/dL

## 2022-11-25 LAB — COMPREHENSIVE METABOLIC PANEL, NON-FASTING
ALBUMIN/GLOBULIN RATIO: 1.6 — ABNORMAL HIGH (ref 0.8–1.4)
ALBUMIN: 4.1 g/dL (ref 3.5–5.7)
ALKALINE PHOSPHATASE: 71 U/L (ref 34–104)
ALT (SGPT): 11 U/L (ref 7–52)
ANION GAP: 5 mmol/L (ref 4–13)
AST (SGOT): 12 U/L — ABNORMAL LOW (ref 13–39)
BILIRUBIN TOTAL: 0.6 mg/dL (ref 0.3–1.0)
BUN/CREA RATIO: 21 (ref 6–22)
BUN: 14 mg/dL (ref 7–25)
CALCIUM, CORRECTED: 8.8 mg/dL — ABNORMAL LOW (ref 8.9–10.8)
CALCIUM: 8.9 mg/dL (ref 8.6–10.3)
CHLORIDE: 91 mmol/L — ABNORMAL LOW (ref 98–107)
CO2 TOTAL: 35 mmol/L — ABNORMAL HIGH (ref 21–31)
CREATININE: 0.67 mg/dL (ref 0.60–1.30)
ESTIMATED GFR: 88 mL/min/{1.73_m2} (ref >59)
GLOBULIN: 2.5 — ABNORMAL LOW (ref 2.9–5.4)
GLUCOSE: 94 mg/dL (ref 74–109)
OSMOLALITY, CALCULATED: 263 mOsm/kg — ABNORMAL LOW (ref 270–290)
POTASSIUM: 3.5 mmol/L (ref 3.5–5.1)
PROTEIN TOTAL: 6.6 g/dL (ref 6.4–8.9)
SODIUM: 131 mmol/L — ABNORMAL LOW (ref 136–145)

## 2022-11-25 LAB — ECG 12 LEAD
Atrial Rate: 90 {beats}/min
Calculated P Axis: 86 degrees
Calculated R Axis: 69 degrees
Calculated T Axis: 73 degrees
PR Interval: 130 ms
QRS Duration: 82 ms
QT Interval: 380 ms
QTC Calculation: 464 ms
Ventricular rate: 90 {beats}/min

## 2022-11-25 LAB — COVID-19, FLU A/B, RSV RAPID BY PCR
INFLUENZA VIRUS TYPE A: NOT DETECTED
INFLUENZA VIRUS TYPE B: NOT DETECTED
RESPIRATORY SYNCTIAL VIRUS (RSV): NOT DETECTED
SARS-CoV-2: NOT DETECTED

## 2022-11-25 LAB — ARTERIAL BLOOD GAS/LACTATE
%FIO2 (ARTERIAL): 32 %
BASE EXCESS (ARTERIAL): 11.7 mmol/L — ABNORMAL HIGH (ref 0.0–2.0)
BICARBONATE (ARTERIAL): 33.7 mmol/L — ABNORMAL HIGH (ref 20.0–26.0)
CARBOXYHEMOGLOBIN: 2.8 % — ABNORMAL HIGH (ref <=1.5)
MET-HEMOGLOBIN: 0.3 % (ref <=2.0)
O2CT: 14.5 %
OXYHEMOGLOBIN: 96 % (ref 88.0–100.0)
PCO2 (ARTERIAL): 60 mm/Hg — ABNORMAL HIGH (ref 35–45)
PH (ARTERIAL): 7.39 (ref 7.35–7.45)
PO2 (ARTERIAL): 118 mm/Hg — ABNORMAL HIGH (ref 80–100)

## 2022-11-25 LAB — TROPONIN-I
TROPONIN I: 8 ng/L (ref <15)
TROPONIN I: 9 ng/L (ref <15)

## 2022-11-25 LAB — PT/INR
INR: 0.95 (ref 0.84–1.10)
PROTHROMBIN TIME: 11.1 seconds (ref 9.8–12.7)

## 2022-11-25 LAB — PTT (PARTIAL THROMBOPLASTIN TIME): APTT: 36 seconds (ref 25.0–38.0)

## 2022-11-25 LAB — URINALYSIS, MICROSCOPIC
NON-SQUAMOUS EPITHELIAL CELLS URINE: <1 /hpf (ref <=1)
RBCS: <1 /hpf (ref <4)
SQUAMOUS EPITHELIAL: 1 /hpf (ref <28)
WBCS: 2 /hpf (ref <6)

## 2022-11-25 LAB — LACTIC ACID LEVEL W/ REFLEX FOR LEVEL >2.0: LACTIC ACID: 0.2 mmol/L — ABNORMAL LOW (ref 0.5–2.2)

## 2022-11-25 MED ORDER — DOXYCYCLINE HYCLATE 100 MG CAPSULE
100.0000 mg | ORAL_CAPSULE | Freq: Two times a day (BID) | ORAL | 0 refills | Status: DC
Start: 2022-11-25 — End: 2022-12-02

## 2022-11-25 MED ORDER — PREDNISONE 20 MG TABLET
20.0000 mg | ORAL_TABLET | Freq: Every day | ORAL | 0 refills | Status: DC
Start: 2022-11-25 — End: 2022-11-30

## 2022-11-25 NOTE — ED Triage Notes (Signed)
Brought to ED via [facility] with c/o chest "burning" and SOB x1 day. Pt also requesting to be checked for a UTI.     [facility]: duoneb, 02 @ 3L/min

## 2022-11-25 NOTE — ED Provider Notes (Signed)
[facility]  ED Primary Provider Note  History of Present Illness   Chief Complaint   Patient presents with    Shortness of Breath    Chest Pain      Michele Fitzgerald is a 82 y.o. female who had concerns including Shortness of Breath and Chest Pain .  Arrival: The patient arrived by Ambulance    Patient 82 year old female past medical history of diastolic heart failure chronic obstructive pulmonary disease oxygen dependence at 3 L baseline presents emergency department with complaints of shortness of breath.  States it has been on and off for the past several days.  States it was more consistent today.  States it is worse when she lies flat.  She was feeling better since the ambulance ride over and breathing treatment.  Denies any fevers or chest pain denies lower extremity swelling or pain.      History Reviewed This Encounter: Medical History  Surgical History  Family History  Social History    Physical Exam   ED Triage Vitals [11/25/22 0234]   BP (Non-Invasive) (!) 178/84   Heart Rate 98   Respiratory Rate 20   Temperature 36.9 C (98.4 F)   SpO2 99 %   Weight 59 kg (130 lb)   Height 1.524 m (5')     Physical Exam  HENT:      Head: Normocephalic.      Nose: Nose normal.      Mouth/Throat:      Mouth: Mucous membranes are moist.   Eyes:      Extraocular Movements: Extraocular movements intact.      Pupils: Pupils are equal, round, and reactive to light.   Cardiovascular:      Rate and Rhythm: Normal rate and regular rhythm.      Pulses: Normal pulses.      Heart sounds: Normal heart sounds.   Pulmonary:      Effort: Pulmonary effort is normal. No respiratory distress.      Comments: Trace bibasilar crackles  Abdominal:      General: Abdomen is flat.      Palpations: Abdomen is soft.   Musculoskeletal:      Cervical back: Normal range of motion.   Neurological:      Mental Status: She is alert.       Patient Data     Labs Ordered/Reviewed   COMPREHENSIVE METABOLIC PANEL,  NON-FASTING - Abnormal; Notable for the following components:       Result Value    SODIUM 131 (*)     CHLORIDE 91 (*)     CO2 TOTAL 35 (*)     AST (SGOT) 12 (*)     ALBUMIN/GLOBULIN RATIO 1.6 (*)     OSMOLALITY, CALCULATED 263 (*)     CALCIUM, CORRECTED 8.8 (*)     GLOBULIN 2.5 (*)     All other components within normal limits    Narrative:     Estimated Glomerular Filtration Rate (eGFR) is calculated using the CKD-EPI (2021) equation, intended for patients 45 years of age and older. If gender is not documented or "unknown", there will be no eGFR calculation.     LACTIC ACID LEVEL W/ REFLEX FOR LEVEL >2.0 - Abnormal; Notable for the following components:    LACTIC ACID 0.2 (*)     All other components within normal limits   ARTERIAL BLOOD GAS/LACTATE - Abnormal; Notable for the following components:    PCO2 (ARTERIAL) 60 (*)  BICARBONATE (ARTERIAL) 33.7 (*)     BASE EXCESS (ARTERIAL) 11.7 (*)     CARBOXYHEMOGLOBIN 2.8 (*)     PO2 (ARTERIAL) 118 (*)     All other components within normal limits   CBC WITH DIFF - Abnormal; Notable for the following components:    RBC 3.58 (*)     HGB 10.5 (*)     HCT 30.9 (*)     MPV 5.9 (*)     All other components within normal limits   B-TYPE NATRIURETIC PEPTIDE (BNP),PLASMA - Normal    Narrative:                                 Class 1: 101-250 pg/mL                              Class 2: 251-550 pg/mL                              Class 3: 551-900 pg/mL                              Class 4: >901 pg/mL     The [employer] Heart Association has developed a four-stage functional classification system for CHF that is based on a subjective interpretation of the severity of a patient's clinical signs and symptoms.    Class 1 - Patients have no limitations on physical activity and have no symptoms with ordinary physical activity.    Class 2 - Patients have a slight limitation of physical activity and have symptoms with ordinary physical activity.    Class 3 - Patients have a marked  limitation of physical activity and have symptoms with less than ordinary physical activity, but not at rest.    Class 4 - Patients are unable to perform any physical activity without discomfort.   COVID-19, FLU A/B, RSV RAPID BY PCR - Normal    Narrative:     Results are for the simultaneous qualitative identification of SARS-CoV-2 (formerly 2019-nCoV), Influenza A, Influenza B, and RSV RNA. These etiologic agents are generally detectable in nasopharyngeal and nasal swabs during the ACUTE PHASE of infection. Hence, this test is intended to be performed on respiratory specimens collected from individuals with signs and symptoms of upper respiratory tract infection who meet Centers for Disease Control and Prevention (CDC) clinical and/or epidemiological criteria for Coronavirus Disease 2019 (COVID-19) testing. CDC COVID-19 criteria for testing on human specimens is available at [facility] webpage information for Healthcare Professionals: Coronavirus Disease 2019 (COVID-19) ([email or web address]).     False-negative results may occur if the virus has genomic mutations, insertions, deletions, or rearrangements or if performed very early in the course of illness. Otherwise, negative results indicate virus specific RNA targets are not detected, however negative results do not preclude SARS-CoV-2 infection/COVID-19, Influenza, or Respiratory syncytial virus infection. Results should not be used as the sole basis for patient management decisions. Negative results must be combined with clinical observations, patient history, and epidemiological information. If upper respiratory tract infection is still suspected based on exposure history together with other clinical findings, re-testing should be considered.    Test methodology:   Cepheid Xpert Xpress SARS-CoV-2/Flu/RSV Assay real-time polymerase chain reaction (RT-PCR) test on the GeneXpert Dx and Xpert Xpress systems.  TROPONIN-I - Normal    PT/INR - Normal    Narrative:     INR OF 2.0-3.0  RECOMMENDED FOR: PROPHYLAXIS/TREATMENT OF VENEOUS THROMBOSIS, PULMONARY EMBOLISM, PREVENTION OF SYSTEMIC EMBOLISM FROM ATRIAL FIBRILATION, MYOCARDIAL INFARCTION.    INR OF 2.5-3.5  RECOMMENDED FOR MECHANICAL PROSTHETIC HEART VALVES, RECURRENT SYSTEMIC EMBOLISM, RECURRENT MYOCARDIAL INFARCTION.     PTT (PARTIAL THROMBOPLASTIN TIME) - Normal   URINALYSIS, MACROSCOPIC - Normal   URINALYSIS, MICROSCOPIC - Normal   ADULT ROUTINE BLOOD CULTURE, SET OF 2 BOTTLES (BACTERIA AND YEAST)   ADULT ROUTINE BLOOD CULTURE, SET OF 2 BOTTLES (BACTERIA AND YEAST)   CBC/DIFF    Narrative:     The following orders were created for panel order CBC/DIFF.  Procedure                               Abnormality         Status                     ---------                               -----------         ------                     CBC WITH IHKV[[id number]]                Abnormal            Final result                 Please view results for these tests on the individual orders.   URINALYSIS, MACROSCOPIC AND MICROSCOPIC W/CULTURE REFLEX    Narrative:     The following orders were created for panel order URINALYSIS, MACROSCOPIC AND MICROSCOPIC W/CULTURE REFLEX [PRN ONLY].  Procedure                               Abnormality         Status                     ---------                               -----------         ------                     URINALYSIS, MACROSCOPIC[[id number]]      Normal              Final result               URINALYSIS, MICROSCOPIC[[id number]]      Normal              Final result                 Please view results for these tests on the individual orders.   TROPONIN-I   TROPONIN-I     No orders to display     Medical Decision Making        Medical Decision Making   patient on baseline requirement oxygen ABG has at patient's baseline without significant hypercapnia or acidosis chest x-ray clear COVID flu  RSV negative BNP within normal limits EKG does not demonstrate any  acute ischemic changes.  Suspect is likely chronic obstructive pulmonary disease exacerbation.  Patient does continue to smoke.  She was written a prescription for prednisone and doxycycline.  She will continue her breathing treatments 4 times per day at home.  She will follow up with the primary care.  If new or worsening symptoms return to the emergency department    Amount and/or Complexity of Data Reviewed  Labs: ordered.  Radiology: ordered.  ECG/medicine tests: ordered.                   Clinical Impression   COPD exacerbation (CMS HCC) (Primary)       Disposition: Discharged

## 2022-11-25 NOTE — Discharge Instructions (Signed)
A prescription for steroids and antibiotics was written take these as prescribed use breathing treatments 4 times per day.  Try and stop smoking.  Follow up with the primary care to make sure getting better.  If new or worsening symptoms return to the emergency department

## 2022-11-26 LAB — ADULT ROUTINE BLOOD CULTURE, SET OF 2 BOTTLES (BACTERIA AND YEAST): BLOOD CULTURE, ROUTINE: NO GROWTH

## 2022-11-28 ENCOUNTER — Emergency Department (HOSPITAL_COMMUNITY): Payer: Medicare Other

## 2022-11-28 ENCOUNTER — Inpatient Hospital Stay (HOSPITAL_COMMUNITY): Payer: Medicare Other

## 2022-11-28 ENCOUNTER — Encounter (HOSPITAL_COMMUNITY): Payer: Self-pay | Admitting: Emergency Medicine

## 2022-11-28 ENCOUNTER — Inpatient Hospital Stay
Admission: EM | Admit: 2022-11-28 | Discharge: 2022-12-02 | DRG: 191 | Disposition: A | Discharge status: Home Health | Payer: Medicare Other | Attending: Internal Medicine | Admitting: Internal Medicine

## 2022-11-28 ENCOUNTER — Other Ambulatory Visit: Payer: Self-pay

## 2022-11-28 DIAGNOSIS — R109 Unspecified abdominal pain: Secondary | ICD-10-CM

## 2022-11-28 DIAGNOSIS — F17210 Nicotine dependence, cigarettes, uncomplicated: Secondary | ICD-10-CM

## 2022-11-28 DIAGNOSIS — F411 Generalized anxiety disorder: Secondary | ICD-10-CM

## 2022-11-28 DIAGNOSIS — K56600 Partial intestinal obstruction, unspecified as to cause: Secondary | ICD-10-CM

## 2022-11-28 DIAGNOSIS — K567 Ileus, unspecified: Secondary | ICD-10-CM

## 2022-11-28 DIAGNOSIS — J441 Chronic obstructive pulmonary disease with (acute) exacerbation: Secondary | ICD-10-CM | POA: Diagnosis present

## 2022-11-28 DIAGNOSIS — E538 Deficiency of other specified B group vitamins: Secondary | ICD-10-CM

## 2022-11-28 DIAGNOSIS — I10 Essential (primary) hypertension: Secondary | ICD-10-CM

## 2022-11-28 DIAGNOSIS — Z8673 Personal history of transient ischemic attack (TIA), and cerebral infarction without residual deficits: Secondary | ICD-10-CM

## 2022-11-28 DIAGNOSIS — G2581 Restless legs syndrome: Secondary | ICD-10-CM

## 2022-11-28 DIAGNOSIS — E785 Hyperlipidemia, unspecified: Secondary | ICD-10-CM

## 2022-11-28 DIAGNOSIS — E876 Hypokalemia: Secondary | ICD-10-CM

## 2022-11-28 DIAGNOSIS — Z720 Tobacco use: Secondary | ICD-10-CM

## 2022-11-28 DIAGNOSIS — R112 Nausea with vomiting, unspecified: Secondary | ICD-10-CM

## 2022-11-28 DIAGNOSIS — I252 Old myocardial infarction: Secondary | ICD-10-CM

## 2022-11-28 LAB — URINALYSIS, MACROSCOPIC
BILIRUBIN: NEGATIVE mg/dL
BLOOD: 0.06 mg/dL — AB
GLUCOSE: NEGATIVE mg/dL
KETONES: NEGATIVE mg/dL
LEUKOCYTES: 75 WBCs/uL — AB
NITRITE: NEGATIVE
PH: 7.5 (ref 5.0–9.0)
PROTEIN: 10 mg/dL
SPECIFIC GRAVITY: 1.012 (ref 1.002–1.030)
UROBILINOGEN: NORMAL mg/dL

## 2022-11-28 LAB — COMPREHENSIVE METABOLIC PANEL, NON-FASTING
ALBUMIN/GLOBULIN RATIO: 1.7 — ABNORMAL HIGH (ref 0.8–1.4)
ALBUMIN: 4.6 g/dL (ref 3.5–5.7)
ALKALINE PHOSPHATASE: 82 U/L (ref 34–104)
ALT (SGPT): 12 U/L (ref 7–52)
ANION GAP: 7 mmol/L (ref 4–13)
AST (SGOT): 14 U/L (ref 13–39)
BILIRUBIN TOTAL: 0.8 mg/dL (ref 0.3–1.0)
BUN/CREA RATIO: 17 (ref 6–22)
BUN: 12 mg/dL (ref 7–25)
CALCIUM, CORRECTED: 9.2 mg/dL (ref 8.9–10.8)
CALCIUM: 9.7 mg/dL (ref 8.6–10.3)
CHLORIDE: 86 mmol/L — ABNORMAL LOW (ref 98–107)
CO2 TOTAL: 36 mmol/L — ABNORMAL HIGH (ref 21–31)
CREATININE: 0.71 mg/dL (ref 0.60–1.30)
ESTIMATED GFR: 85 mL/min/{1.73_m2} (ref >59)
GLOBULIN: 2.7 — ABNORMAL LOW (ref 2.9–5.4)
GLUCOSE: 128 mg/dL — ABNORMAL HIGH (ref 74–109)
OSMOLALITY, CALCULATED: 260 mOsm/kg — ABNORMAL LOW (ref 270–290)
POTASSIUM: 3.2 mmol/L — ABNORMAL LOW (ref 3.5–5.1)
PROTEIN TOTAL: 7.3 g/dL (ref 6.4–8.9)
SODIUM: 129 mmol/L — ABNORMAL LOW (ref 136–145)

## 2022-11-28 LAB — TROPONIN-I: TROPONIN I: 15 ng/L — ABNORMAL HIGH (ref <15)

## 2022-11-28 LAB — ARTERIAL BLOOD GAS/LACTATE
%FIO2 (ARTERIAL): 28 %
BASE EXCESS (ARTERIAL): 12.8 mmol/L — ABNORMAL HIGH (ref 0.0–2.0)
BICARBONATE (ARTERIAL): 34.5 mmol/L — ABNORMAL HIGH (ref 20.0–26.0)
CARBOXYHEMOGLOBIN: 2.1 % — ABNORMAL HIGH (ref <=1.5)
MET-HEMOGLOBIN: 0.2 % (ref <=2.0)
O2CT: 15.3 %
OXYHEMOGLOBIN: 91.6 % (ref 88.0–100.0)
PCO2 (ARTERIAL): 58 mm/Hg — ABNORMAL HIGH (ref 35–45)
PH (ARTERIAL): 7.42 (ref 7.35–7.45)
PO2 (ARTERIAL): 67 mm/Hg — ABNORMAL LOW (ref 80–100)

## 2022-11-28 LAB — CBC WITH DIFF
BASOPHIL #: 0 10*3/uL (ref 0.00–0.10)
BASOPHIL %: 0 % (ref 0–1)
EOSINOPHIL #: 0.1 10*3/uL (ref 0.00–0.50)
EOSINOPHIL %: 1 % (ref 1–7)
HCT: 35.1 % (ref 31.2–41.9)
HGB: 11.9 g/dL (ref 10.9–14.3)
LYMPHOCYTE #: 1.3 10*3/uL (ref 1.00–3.00)
LYMPHOCYTE %: 13 % — ABNORMAL LOW (ref 16–44)
MCH: 28.9 pg (ref 24.7–32.8)
MCHC: 33.9 g/dL (ref 32.3–35.6)
MCV: 85.3 fL (ref 75.5–95.3)
MONOCYTE #: 0.5 10*3/uL (ref 0.30–1.00)
MONOCYTE %: 5 % (ref 5–13)
MPV: 5.9 fL — ABNORMAL LOW (ref 7.9–10.8)
NEUTROPHIL #: 7.9 10*3/uL — ABNORMAL HIGH (ref 1.85–7.80)
NEUTROPHIL %: 81 % — ABNORMAL HIGH (ref 43–77)
PLATELETS: 425 10*3/uL (ref 140–440)
RBC: 4.12 10*6/uL (ref 3.63–4.92)
RDW: 13.8 % (ref 12.3–17.7)
WBC: 9.8 10*3/uL (ref 3.8–11.8)

## 2022-11-28 LAB — MAGNESIUM: MAGNESIUM: 1.6 mg/dL — ABNORMAL LOW (ref 1.9–2.7)

## 2022-11-28 LAB — LIPASE: LIPASE: 11 U/L (ref 11–82)

## 2022-11-28 LAB — PT/INR
INR: 0.95 (ref 0.84–1.10)
PROTHROMBIN TIME: 11.1 seconds (ref 9.8–12.7)

## 2022-11-28 LAB — URINALYSIS, MICROSCOPIC
RBCS: 9 /hpf — ABNORMAL HIGH (ref <4)
SQUAMOUS EPITHELIAL: 1 /hpf (ref <28)
WBCS: 2 /hpf (ref <6)

## 2022-11-28 LAB — PTT (PARTIAL THROMBOPLASTIN TIME): APTT: 32.3 seconds (ref 25.0–38.0)

## 2022-11-28 LAB — LACTIC ACID LEVEL W/ REFLEX FOR LEVEL >2.0: LACTIC ACID: 0.8 mmol/L (ref 0.5–2.2)

## 2022-11-28 LAB — C-REACTIVE PROTEIN(CRP),INFLAMMATION: C-REACTIVE PROTEIN (CRP): <0.5 mg/dL (ref 0.1–0.5)

## 2022-11-28 LAB — B-TYPE NATRIURETIC PEPTIDE (BNP),PLASMA: BNP: 195 pg/mL — ABNORMAL HIGH (ref 1–100)

## 2022-11-28 MED ORDER — FLUOXETINE 20 MG CAPSULE
20.0000 mg | ORAL_CAPSULE | Freq: Every day | ORAL | Status: DC
Start: 2022-11-28 — End: 2022-12-02
  Administered 2022-11-28 – 2022-12-02 (×5): 20 mg via ORAL
  Filled 2022-11-28 (×7): qty 1

## 2022-11-28 MED ORDER — IOHEXOL 350 MG IODINE/ML INTRAVENOUS SOLUTION
75.0000 mL | INTRAVENOUS | Status: AC
Start: 2022-11-28 — End: 2022-11-28
  Administered 2022-11-28: 75 mL via INTRAVENOUS

## 2022-11-28 MED ORDER — GUAIFENESIN 100 MG/5 ML ORAL LIQUID
200.0000 mg | ORAL | Status: DC | PRN
Start: 2022-11-28 — End: 2022-12-02

## 2022-11-28 MED ORDER — METHYLPREDNISOLONE SOD SUCC 125 MG SOLUTION FOR INJECTION WRAPPER
INTRAVENOUS | Status: AC
Start: 2022-11-28 — End: 2022-11-28
  Filled 2022-11-28: qty 2

## 2022-11-28 MED ORDER — ONDANSETRON HCL (PF) 4 MG/2 ML INJECTION SOLUTION
4.0000 mg | INTRAMUSCULAR | Status: AC
Start: 2022-11-28 — End: 2022-11-28
  Administered 2022-11-28: 4 mg via INTRAVENOUS

## 2022-11-28 MED ORDER — ENOXAPARIN 40 MG/0.4 ML SUBCUTANEOUS SYRINGE
INJECTION | SUBCUTANEOUS | Status: AC
Start: 2022-11-28 — End: 2022-11-28
  Filled 2022-11-28: qty 0.4

## 2022-11-28 MED ORDER — SODIUM CHLORIDE 0.9 % INTRAVENOUS SOLUTION
Freq: Once | INTRAVENOUS | Status: AC
Start: 2022-11-28 — End: 2022-11-28

## 2022-11-28 MED ORDER — POLYETHYLENE GLYCOL 3350 17 GRAM ORAL POWDER PACKET
17.0000 g | Freq: Every day | ORAL | Status: DC
Start: 2022-11-28 — End: 2022-12-02
  Administered 2022-11-28: 0 g via ORAL
  Administered 2022-11-29 – 2022-12-01 (×3): 17 g via ORAL
  Administered 2022-12-02: 0 g via ORAL
  Filled 2022-11-28 (×4): qty 1

## 2022-11-28 MED ORDER — POTASSIUM CHLORIDE 20 MEQ/100ML IN STERILE WATER INTRAVENOUS PIGGYBACK
20.0000 meq | INJECTION | INTRAVENOUS | Status: AC
Start: 2022-11-28 — End: 2022-11-28
  Administered 2022-11-28 (×2): 0 meq via INTRAVENOUS
  Administered 2022-11-28 (×2): 20 meq via INTRAVENOUS
  Filled 2022-11-28 (×2): qty 100

## 2022-11-28 MED ORDER — ALUMINUM-MAG HYDROXIDE-SIMETHICONE 200 MG-200 MG-20 MG/5 ML ORAL SUSP
15.0000 mL | Freq: Four times a day (QID) | ORAL | Status: DC | PRN
Start: 2022-11-28 — End: 2022-12-02
  Administered 2022-12-01: 15 mL via ORAL
  Filled 2022-11-28: qty 30

## 2022-11-28 MED ORDER — SENNOSIDES 8.6 MG-DOCUSATE SODIUM 50 MG TABLET
1.0000 | ORAL_TABLET | Freq: Two times a day (BID) | ORAL | Status: DC
Start: 2022-11-28 — End: 2022-12-02
  Administered 2022-11-28 (×2): 0 via ORAL
  Administered 2022-11-29 – 2022-12-02 (×7): 1 via ORAL
  Filled 2022-11-28 (×8): qty 1

## 2022-11-28 MED ORDER — CEFEPIME 2 GRAM SOLUTION FOR INJECTION
INTRAMUSCULAR | Status: AC
Start: 2022-11-28 — End: 2022-11-28
  Filled 2022-11-28: qty 12.5

## 2022-11-28 MED ORDER — ALUMINUM-MAG HYDROXIDE-SIMETHICONE 200 MG-200 MG-20 MG/5 ML ORAL SUSP
30.0000 mL | ORAL | Status: DC | PRN
Start: 2022-11-28 — End: 2022-12-02

## 2022-11-28 MED ORDER — SODIUM CHLORIDE 0.9 % INTRAVENOUS PIGGYBACK
2.0000 g | Freq: Two times a day (BID) | INTRAVENOUS | Status: DC
Start: 2022-11-28 — End: 2022-12-02
  Administered 2022-11-28: 0 g via INTRAVENOUS
  Administered 2022-11-28 (×2): 2 g via INTRAVENOUS
  Administered 2022-11-28: 0 g via INTRAVENOUS
  Administered 2022-11-29: 2 g via INTRAVENOUS
  Administered 2022-11-29: 0 g via INTRAVENOUS
  Administered 2022-11-29: 2 g via INTRAVENOUS
  Administered 2022-11-29 – 2022-11-30 (×2): 0 g via INTRAVENOUS
  Administered 2022-11-30: 2 g via INTRAVENOUS
  Administered 2022-11-30: 0 g via INTRAVENOUS
  Administered 2022-11-30 – 2022-12-01 (×3): 2 g via INTRAVENOUS
  Administered 2022-12-01 (×2): 0 g via INTRAVENOUS
  Administered 2022-12-02: 2 g via INTRAVENOUS
  Administered 2022-12-02: 0 g via INTRAVENOUS
  Filled 2022-11-28 (×8): qty 12.5

## 2022-11-28 MED ORDER — ACETAMINOPHEN 325 MG TABLET
ORAL_TABLET | ORAL | Status: AC
Start: 2022-11-28 — End: 2022-11-28
  Filled 2022-11-28: qty 2

## 2022-11-28 MED ORDER — LORAZEPAM 0.5 MG TABLET
0.5000 mg | ORAL_TABLET | Freq: Two times a day (BID) | ORAL | Status: AC
Start: 2022-11-28 — End: ?
  Administered 2022-11-28 – 2022-11-29 (×4): 0.5 mg via ORAL
  Administered 2022-11-30: 0 mg via ORAL
  Administered 2022-11-30: 0.5 mg via ORAL
  Filled 2022-11-28 (×5): qty 1

## 2022-11-28 MED ORDER — DIATRIZOATE MEGLUMINE-DIATRIZOATE SODIUM 66 %-10 % ORAL SOLUTION
30.0000 mL | ORAL | Status: AC
Start: 2022-11-28 — End: 2022-11-28
  Administered 2022-11-28: 30 mL via ORAL

## 2022-11-28 MED ORDER — METHYLPREDNISOLONE SOD SUCC 125 MG SOLUTION FOR INJECTION WRAPPER
125.0000 mg | INTRAVENOUS | Status: AC
Start: 2022-11-28 — End: 2022-11-28
  Administered 2022-11-28: 125 mg via INTRAVENOUS

## 2022-11-28 MED ORDER — ONDANSETRON 4 MG DISINTEGRATING TABLET
4.0000 mg | ORAL_TABLET | Freq: Three times a day (TID) | ORAL | 0 refills | Status: DC | PRN
Start: 2022-11-28 — End: 2022-12-02

## 2022-11-28 MED ORDER — ONDANSETRON HCL (PF) 4 MG/2 ML INJECTION SOLUTION
4.0000 mg | Freq: Three times a day (TID) | INTRAMUSCULAR | Status: DC | PRN
Start: 2022-11-28 — End: 2022-11-29
  Administered 2022-11-29 (×2): 4 mg via INTRAVENOUS
  Filled 2022-11-28 (×2): qty 2

## 2022-11-28 MED ORDER — BENZONATATE 100 MG CAPSULE
100.0000 mg | ORAL_CAPSULE | Freq: Three times a day (TID) | ORAL | Status: DC | PRN
Start: 2022-11-28 — End: 2022-12-02

## 2022-11-28 MED ORDER — SODIUM CHLORIDE 0.9 % INTRAVENOUS PIGGYBACK
INJECTION | INTRAVENOUS | Status: AC
Start: 2022-11-28 — End: 2022-11-28
  Filled 2022-11-28: qty 100

## 2022-11-28 MED ORDER — IPRATROPIUM 0.5 MG-ALBUTEROL 3 MG (2.5 MG BASE)/3 ML NEBULIZATION SOLN
3.0000 mL | INHALATION_SOLUTION | RESPIRATORY_TRACT | Status: AC
Start: 2022-11-28 — End: 2022-11-28
  Administered 2022-11-28: 3 mL via RESPIRATORY_TRACT

## 2022-11-28 MED ORDER — CLOPIDOGREL 75 MG TABLET
75.0000 mg | ORAL_TABLET | Freq: Every day | ORAL | Status: DC
Start: 2022-11-28 — End: 2022-12-02
  Administered 2022-11-28 – 2022-12-02 (×5): 75 mg via ORAL
  Filled 2022-11-28 (×5): qty 1

## 2022-11-28 MED ORDER — ENOXAPARIN 40 MG/0.4 ML SUBCUTANEOUS SYRINGE
40.0000 mg | INJECTION | SUBCUTANEOUS | Status: DC
Start: 2022-11-28 — End: 2022-12-02
  Administered 2022-11-28 – 2022-12-02 (×5): 40 mg via SUBCUTANEOUS
  Filled 2022-11-28 (×4): qty 0.4

## 2022-11-28 MED ORDER — POTASSIUM CHLORIDE 20 MEQ/L IN DEXTROSE 5 %-0.45 % SODIUM CHLORIDE IV
INTRAVENOUS | Status: DC
Start: 2022-11-28 — End: 2022-11-30
  Administered 2022-11-30: 0 mL via INTRAVENOUS
  Filled 2022-11-28 (×3): qty 1000

## 2022-11-28 MED ORDER — ONDANSETRON HCL (PF) 4 MG/2 ML INJECTION SOLUTION
4.0000 mg | Freq: Four times a day (QID) | INTRAMUSCULAR | Status: DC | PRN
Start: 2022-11-28 — End: 2022-11-28

## 2022-11-28 MED ORDER — ACETAMINOPHEN 325 MG TABLET
650.0000 mg | ORAL_TABLET | ORAL | Status: DC | PRN
Start: 2022-11-28 — End: 2022-11-29

## 2022-11-28 MED ORDER — ONDANSETRON HCL (PF) 4 MG/2 ML INJECTION SOLUTION
INTRAMUSCULAR | Status: AC
Start: 2022-11-28 — End: 2022-11-28
  Filled 2022-11-28: qty 2

## 2022-11-28 MED ORDER — POTASSIUM CHLORIDE 20 MEQ/L IN DEXTROSE 5 %-0.45 % SODIUM CHLORIDE IV
INTRAVENOUS | Status: AC
Start: 2022-11-28 — End: 2022-11-28
  Filled 2022-11-28: qty 1000

## 2022-11-28 MED ORDER — METHYLPREDNISOLONE SOD SUCC 125 MG SOLUTION FOR INJECTION WRAPPER
62.5000 mg | Freq: Four times a day (QID) | INTRAVENOUS | Status: DC
Start: 2022-11-28 — End: 2022-11-30
  Administered 2022-11-28 – 2022-11-30 (×10): 62.5 mg via INTRAVENOUS
  Filled 2022-11-28 (×10): qty 2

## 2022-11-28 MED ORDER — ACETAMINOPHEN 325 MG TABLET
650.0000 mg | ORAL_TABLET | ORAL | Status: DC | PRN
Start: 2022-11-28 — End: 2022-12-02
  Administered 2022-11-28: 650 mg via ORAL
  Filled 2022-11-28: qty 2

## 2022-11-28 MED ORDER — SODIUM CHLORIDE 0.9 % INTRAVENOUS SOLUTION
2.0000 g | Freq: Two times a day (BID) | INTRAVENOUS | Status: DC
Start: 2022-11-28 — End: 2022-11-28

## 2022-11-28 MED ORDER — METOPROLOL TARTRATE 25 MG TABLET
12.5000 mg | ORAL_TABLET | Freq: Two times a day (BID) | ORAL | Status: DC
Start: 2022-11-28 — End: 2022-11-30
  Administered 2022-11-28 – 2022-11-30 (×5): 12.5 mg via ORAL
  Filled 2022-11-28 (×5): qty 1

## 2022-11-28 MED ORDER — MORPHINE 4 MG/ML INJECTION WRAPPER
INJECTION | INTRAMUSCULAR | Status: AC
Start: 2022-11-28 — End: 2022-11-28
  Filled 2022-11-28: qty 1

## 2022-11-28 MED ORDER — ASPIRIN 81 MG CHEWABLE TABLET
81.0000 mg | CHEWABLE_TABLET | Freq: Every day | ORAL | Status: DC
Start: 2022-11-28 — End: 2022-12-02
  Administered 2022-11-28 – 2022-12-02 (×5): 81 mg via ORAL
  Filled 2022-11-28 (×5): qty 1

## 2022-11-28 MED ORDER — MAGNESIUM SULFATE 1 GRAM/100 ML IN DEXTROSE 5 % INTRAVENOUS PIGGYBACK
1.0000 g | INJECTION | INTRAVENOUS | Status: AC
Start: 2022-11-28 — End: 2022-11-28
  Administered 2022-11-28 (×2): 0 g via INTRAVENOUS
  Administered 2022-11-28 (×2): 1 g via INTRAVENOUS
  Filled 2022-11-28 (×2): qty 100

## 2022-11-28 MED ORDER — IPRATROPIUM 0.5 MG-ALBUTEROL 3 MG (2.5 MG BASE)/3 ML NEBULIZATION SOLN
3.0000 mL | INHALATION_SOLUTION | RESPIRATORY_TRACT | Status: DC | PRN
Start: 2022-11-28 — End: 2022-12-02
  Administered 2022-11-29 – 2022-12-01 (×3): 3 mL via RESPIRATORY_TRACT

## 2022-11-28 MED ORDER — ACETAMINOPHEN 325 MG TABLET
650.0000 mg | ORAL_TABLET | ORAL | Status: AC
Start: 2022-11-28 — End: 2022-11-28
  Administered 2022-11-28: 650 mg via ORAL

## 2022-11-28 MED ORDER — MORPHINE 4 MG/ML INJECTION WRAPPER
4.0000 mg | INJECTION | INTRAMUSCULAR | Status: AC
Start: 2022-11-28 — End: 2022-11-28
  Administered 2022-11-28: 4 mg via INTRAVENOUS

## 2022-11-28 MED ORDER — IPRATROPIUM 0.5 MG-ALBUTEROL 3 MG (2.5 MG BASE)/3 ML NEBULIZATION SOLN
3.0000 mL | INHALATION_SOLUTION | Freq: Four times a day (QID) | RESPIRATORY_TRACT | Status: DC
Start: 2022-11-28 — End: 2022-12-02
  Administered 2022-11-28 (×4): 3 mL via RESPIRATORY_TRACT
  Administered 2022-11-29: 0 mL via RESPIRATORY_TRACT
  Administered 2022-11-29 – 2022-11-30 (×6): 3 mL via RESPIRATORY_TRACT
  Administered 2022-11-30: 0 mL via RESPIRATORY_TRACT
  Administered 2022-12-01 (×2): 3 mL via RESPIRATORY_TRACT
  Administered 2022-12-01: 0 mL via RESPIRATORY_TRACT
  Administered 2022-12-01 – 2022-12-02 (×2): 3 mL via RESPIRATORY_TRACT

## 2022-11-28 MED ORDER — ROPINIROLE 0.5 MG TABLET
0.5000 mg | ORAL_TABLET | Freq: Two times a day (BID) | ORAL | Status: DC
Start: 2022-11-28 — End: 2022-12-02
  Administered 2022-11-28 – 2022-12-02 (×9): 0.5 mg via ORAL
  Filled 2022-11-28 (×12): qty 1

## 2022-11-28 NOTE — ED Provider Notes (Addendum)
[facility]  ED Primary Provider Note        Arrival: The patient arrived by Ambulance     History of Present Illness   chief complaint  Michele Fitzgerald is a 82 y.o. female who had concerns including Shortness of Breath.  25 female that presents emergency room with a chief complaint of shortness a breath.  Patient has a longstanding history of chronic obstructive pulmonary disease.  She was O2 dependent.  She was has been using 2-3 L via nasal cannula continuously.  Patient does continue to smoke.  Patient was seen here in the emergency room; 3 days ago for same.  Patient states she was also had some nausea.  She was denying any chest pain.  She was also had generalized weakness.  No fever.  Upon arrival to emergency room blood pressure is 165/84 with a heart rate of 105, respiratory rate of 21 with an oxygen saturation 96% on room 2 L via nasal cannula, afebrile at 97.8  All nursing notes reviewed    Review of Systems     No other overt Review of Systems are noted to be positive except noted in the HPI.      Historical Data   History Reviewed This Encounter: Medical History  Surgical History  Family History  Social History      Physical Exam   ED Triage Vitals [11/28/22 0049]   BP (Non-Invasive) 124/79   Heart Rate (!) 108   Respiratory Rate (!) 22   Temperature 36.6 C (97.8 F)   SpO2 98 %   Weight    Height 1.549 m (5\' 1" )         Exam:   Constitutional:  Patient alert orient x3 in no apparent distress.  No limitations.  Patient does have an odor of cigarettes  Head: Atraumatic normocephalic  Eyes :  Pupils are equal round reactive to light and accommodation extraocular muscles are intact.  Sclera and conjunctiva are unremarkable  Ears:  Tympanic membranes are pearly gray bilaterally; external auditory canals are unremarkable; external ears without any lesions  Nose:  Nares are patent turbinates are pink and moist  Mouth:  Mucosa is pink and moist without lesions.   Posterior pharynx is pink and moist without hypertrophy/exudate.  Neck:  Soft and supple without palpable lymphadenopathy.  Heart:  Tachycardic with a rate of 108, 1/6 holosystolic ejection murmur   Lungs:  Bilateral expiratory wheeze without any rales or rhonchi.  Patient currently wearing 2 L via nasal cannula    Abdomen:  Soft with moderate tenderness to palpation without any rebound or guarding; positive bowel sounds throughout.  Tympanic to percussion  Genitalia:  Deferred  Skin:  Warm and dry without lesions.  Normal skin turgor.  Brisk capillary refill distally  Extremities:  Good strength bilaterally with full range of motion of upper and lower extremities.  1+ pitting edema to bilateral lower extremity.  Neuro:  Alert oriented x3.  Cranial nerves II-XII grossly intact as tested.  Excellent sensation distally over all dermatomes.  Psychiatric:  Patient cooperative, affect appropriate, insight and judgment good          Procedures           Patient Data     Labs Ordered/Reviewed   URINALYSIS, MACROSCOPIC - Abnormal; Notable for the following components:       Result Value    LEUKOCYTES 75 (*)     BLOOD 0.06 (*)  All other components within normal limits   URINALYSIS, MICROSCOPIC - Abnormal; Notable for the following components:    RBCS 9 (*)     All other components within normal limits   ARTERIAL BLOOD GAS/LACTATE - Abnormal; Notable for the following components:    PCO2 (ARTERIAL) 58 (*)     BICARBONATE (ARTERIAL) 34.5 (*)     BASE EXCESS (ARTERIAL) 12.8 (*)     CARBOXYHEMOGLOBIN 2.1 (*)     PO2 (ARTERIAL) 67 (*)     All other components within normal limits   B-TYPE NATRIURETIC PEPTIDE (BNP),PLASMA - Abnormal; Notable for the following components:    BNP 195 (*)     All other components within normal limits    Narrative:                                 Class 1: 101-250 pg/mL                              Class 2: 251-550 pg/mL                              Class 3: 551-900 pg/mL                               Class 4: >901 pg/mL     The [employer] Heart Association has developed a four-stage functional classification system for CHF that is based on a subjective interpretation of the severity of a patient's clinical signs and symptoms.    Class 1 - Patients have no limitations on physical activity and have no symptoms with ordinary physical activity.    Class 2 - Patients have a slight limitation of physical activity and have symptoms with ordinary physical activity.    Class 3 - Patients have a marked limitation of physical activity and have symptoms with less than ordinary physical activity, but not at rest.    Class 4 - Patients are unable to perform any physical activity without discomfort.   COMPREHENSIVE METABOLIC PANEL, NON-FASTING - Abnormal; Notable for the following components:    SODIUM 129 (*)     POTASSIUM 3.2 (*)     CHLORIDE 86 (*)     CO2 TOTAL 36 (*)     GLUCOSE 128 (*)     ALBUMIN/GLOBULIN RATIO 1.7 (*)     OSMOLALITY, CALCULATED 260 (*)     GLOBULIN 2.7 (*)     All other components within normal limits    Narrative:     Estimated Glomerular Filtration Rate (eGFR) is calculated using the CKD-EPI (2021) equation, intended for patients 43 years of age and older. If gender is not documented or "unknown", there will be no eGFR calculation.     MAGNESIUM - Abnormal; Notable for the following components:    MAGNESIUM 1.6 (*)     All other components within normal limits   TROPONIN-I - Abnormal; Notable for the following components:    TROPONIN I 15 (*)     All other components within normal limits   CBC WITH DIFF - Abnormal; Notable for the following components:    MPV 5.9 (*)     NEUTROPHIL % 81 (*)     LYMPHOCYTE % 13 (*)     NEUTROPHIL #  7.90 (*)     All other components within normal limits   C-REACTIVE PROTEIN(CRP),INFLAMMATION - Normal   LACTIC ACID LEVEL W/ REFLEX FOR LEVEL >2.0 - Normal   PT/INR - Normal    Narrative:     INR OF 2.0-3.0  RECOMMENDED FOR: PROPHYLAXIS/TREATMENT OF VENEOUS THROMBOSIS,  PULMONARY EMBOLISM, PREVENTION OF SYSTEMIC EMBOLISM FROM ATRIAL FIBRILATION, MYOCARDIAL INFARCTION.    INR OF 2.5-3.5  RECOMMENDED FOR MECHANICAL PROSTHETIC HEART VALVES, RECURRENT SYSTEMIC EMBOLISM, RECURRENT MYOCARDIAL INFARCTION.     PTT (PARTIAL THROMBOPLASTIN TIME) - Normal   LIPASE - Normal   ADULT ROUTINE BLOOD CULTURE, SET OF 2 BOTTLES (BACTERIA AND YEAST)   ADULT ROUTINE BLOOD CULTURE, SET OF 2 BOTTLES (BACTERIA AND YEAST)   URINE CULTURE,ROUTINE   URINALYSIS, MACROSCOPIC AND MICROSCOPIC W/CULTURE REFLEX    Narrative:     The following orders were created for panel order URINALYSIS, MACROSCOPIC AND MICROSCOPIC W/CULTURE REFLEX.  Procedure                               Abnormality         Status                     ---------                               -----------         ------                     URINALYSIS, MACROSCOPIC[[id number]]      Abnormal            Final result               URINALYSIS, MICROSCOPIC[[id number]]      Abnormal            Final result                 Please view results for these tests on the individual orders.   CBC/DIFF    Narrative:     The following orders were created for panel order CBC/DIFF.  Procedure                               Abnormality         Status                     ---------                               -----------         ------                     CBC WITH GEXB[[id number]]                Abnormal            Final result                 Please view results for these tests on the individual orders.       CT ABDOMEN PELVIS W IV CONTRAST   Final Result by Edi, Radresults In (09/02 0714)   1. GASTRIC AND PROXIMAL SMALL BOWEL DISTENTION WITH AIR-FLUID LEVELS WITH  NORMAL CALIBER TO THE DISTAL SMALL BOWEL. THIS COULD BE DUE TO AN ILEUS. AN EARLY OR PARTIAL SMALL BOWEL OBSTRUCTION IS NOT EXCLUDED   2. NO OTHER SIGNIFICANT FINDINGS          One or more dose reduction techniques were used (e.g., Automated exposure control, adjustment of the mA and/or kV according to patient  size, use of iterative reconstruction technique).         Radiologist location ID: [id number]         XR KUB AND UPRIGHT ABDOMEN   Final Result by Edi, Radresults In (09/02 0541)   At this time the bowel gas pattern appears more consistent with process such as enteritis   No free air   Continued clinical follow-up recommended            Radiologist location ID: [id number]         XR AP MOBILE CHEST   Final Result by Edi, Radresults In (09/02 0558)   NO ACUTE FINDINGS.            Radiologist location ID: [id number]             Medical Decision Making          Medical Decision Making  Single portable view chest shows no acute infiltrate, no acute disease process.  Patient ordered Solu-Medrol 125 along with a DuoNeb upon arrival.  She was complaining of discomfort.  She was given acetaminophen 650.  She was continued to have discomfort.  She was given morphine 4 mg IV with Zofran 4 mg IV.  Urinalysis shows 75 leukocyte esterase with 9 red blood cells per high-power field.  Arterial blood gas shows a pH of 7.42 with a pCO2 of 58 and a PaO2 of 67.  O2 sat was 91.6 on her 2 L. EKG shows sinus tachycardia with a rate of 111 with a normal axis, normal R-wave progression, no ectopy, no ischemia.  White count 9.8.  Left shift of 81% neutrophils.  Remainder of CBC unremarkable.  Sodium was 129 with a potassium of 3.2, chloride 86 with a bicarb of 36 with a glucose of 128.  BNP was 195.  Troponin was 15.  Patient did have a flat and upright of the abdomen was shows moderate amount of gas in the colon with some air-fluid levels and mildly distended bowel loops.  No significant distention.  CT of abdomen pelvis with IV and oral contrast ordered  CTA of abdomen pelvis does show some dilated bowel loops.  This is ileus versus partial small-bowel obstruction.  I have discussed the patient with Dr. Patrica Duel.  Patient be admitted to the hospitalist service.  NG tube has been placed.  Patient be maintained NPO.    Amount  and/or Complexity of Data Reviewed  Labs: ordered. Decision-making details documented in ED Course.  Radiology: ordered and independent interpretation performed. Decision-making details documented in ED Course.  ECG/medicine tests: ordered and independent interpretation performed. Decision-making details documented in ED Course.    Risk  Prescription drug management.  Parenteral controlled substances.  Decision regarding hospitalization.    Critical Care  Total time providing critical care: 0 minutes        ED Course as of 11/28/22 0726   Mon Nov 28, 2022   0306 Urinalysis shows leukocyte esterase of 75 with a 9 red blood cells per high-power field otherwise urinalysis unremarkable   0307 Arterial blood gas shows pH of 7.42, pCO2 of 58, PaO2 of 67,  O2 sat of 91.6 on 2 L via nasal cannula.  Bicarb is 34.5 base excess 12.8   0325 EKG shows sinus tachycardia with a rate of 117, normal axis, normal R-wave progression, frequent ectopy, normal PR/QRS intervals.   0445 White count 9.8 with a hemoglobin 11.9, hematocrit of 35.1, platelets of 425.  Left shift of 81% neutrophils   0446 Sodium was 129 with a potassium of 3.2, chloride 86, bicarb of 36 ; glucose 128   0446 Magnesium 1.6, lipase 11, lactic 0.8, CRP 0.5, BNP 195 with a troponin of 15   0446 PT/INR/PTT are all within normal range   0452 Single portable view chest shows no acute infiltrate, no acute disease process   0548 Flat and upright of abdomen:FINDINGS:  There is moderate gas in the colon. There are some mildly distended air-filled small bowel loops without pathologic dilatation and no abnormal distention of the stomach at this time. Scattered air-fluid levels involving nondilated bowel on the upright view is nonspecific but could be seen with enteritis in the appropriate clinical setting.  No free air.     No suspicious calcifications.     Degenerative changes of the spine and hips  Atherosclerotic calcified wall plaque at the aorta. Lung bases are clear         IMPRESSION:  At this time the bowel gas pattern appears more consistent with process such as enteritis  No free air  Continued clinical follow-up recommended     0717 CT abdomen pelvis with IV and oral contrast:IMPRESSION:  1.GASTRIC AND PROXIMAL SMALL BOWEL DISTENTION WITH AIR-FLUID LEVELS WITH NORMAL CALIBER TO THE DISTAL SMALL BOWEL. THIS COULD BE DUE TO AN ILEUS. AN EARLY OR PARTIAL SMALL BOWEL OBSTRUCTION IS NOT EXCLUDED  2.         NO OTHER SIGNIFICANT FINDINGS            Medications Administered in the ED   NS premix infusion (has no administration in time range)   ipratropium-albuterol 0.5 mg-3 mg(2.5 mg base)/3 mL Solution for Nebulization (3 mL Nebulization Given 11/28/22 0233)   methylPREDNISolone sod succ (SOLU-medrol) 125 mg/2 mL injection (125 mg Intravenous Given 11/28/22 0229)   ondansetron (ZOFRAN) 2 mg/mL injection (4 mg Intravenous Given 11/28/22 0229)   acetaminophen (TYLENOL) tablet (650 mg Oral Given 11/28/22 0313)   morphine 4 mg/mL injection (4 mg Intravenous Given 11/28/22 0341)   iohexol (OMNIPAQUE 350) infusion (75 mL Intravenous Given 11/28/22 0706)   diatrizoate meglumine & sodium oral solution (30 mL Oral Given 11/28/22 0706)       Patient will be admitted to the  service for further workup and management.    Disposition: Admitted               Clinical Impression   Acute exacerbation of chronic obstructive pulmonary disease (CMS HCC) (Primary)   Partial small bowel obstruction (CMS HCC)   Abdominal pain, unspecified abdominal location   Nausea and vomiting   Tobacco abuse         Current Discharge Medication List        START taking these medications    Details   ondansetron (ZOFRAN ODT) 4 mg Oral Tablet, Rapid Dissolve Take 1 Tablet (4 mg total) by mouth Every 8 hours as needed for Nausea/Vomiting  Qty: 12 Tablet, Refills: 0             R.A. Santiago Bumpers, DO  Department of Emergency Medicine

## 2022-11-28 NOTE — Nurses Notes (Signed)
Unable to verify home meds, patients pharmacy is closed for holiday and patient does not know home medication list.

## 2022-11-28 NOTE — ED Nurses Note (Signed)
PT AWAKE UPON ENTERING ROOM, She IS ASKING ABOUT SOMETHING TO HELP CALM HER DOWN, She SAYS THAT She TAKES ATIVAN AT HOME, ALSO C/O HEADACHE, ORDERS NOTED, NGT INSERTED RT NARE PER ORDER, PT TOLERATED WELL, AND OBTAINED SM AMT BROWNISH RED LIQUID. I HAVE BEEPED DR DANIEL TO VERIFY WHICH CONT IV THEY WANT BECAUSE She HAS  3 ORDERED, ALSO TO ASK ABOUT SOMETHING FOR HER NERVES. PT HAS PURE WICK IN PLACE AND HAS OF YELLOW URINE,

## 2022-11-28 NOTE — H&P (Addendum)
[place] [facility]    HOSPITALIST H&P    Michele Fitzgerald 82 y.o. female 329/B   Date of Service: 11/28/2022    Date of Admission:  11/28/2022   PCP: Myra Gianotti Hill-Hurt, PA-C Code Status:No CPR       Chief Complaint:  " Shortness of breath "    HPI:   Michele Fitzgerald is a 82 y.o. female who presented to [facility] due to worsening shortness of breath.    Patient has a longstanding history of chronic obstructive pulmonary disease.  She wears 2-3 liters chronically at home and admits to smoking 1/2 pack of cigarettes daily.   Patient was seen in the emergency room; 3 days ago for same complaint.   Patient admits to having a mild nonproductive cough but denies any fever or chills.  She also admitted to having some nausea, but denied any vomiting.   She was also had generalized weakness.  While in the ER the patient was found to have a mildly distended abdomen and an ileus vs early SBO was found on KUB.  An NG tube was placed and her nausea resolved.  The patient was admitted to the hospitalist service for AECOPD and ileus.            ED medications:   Medications Administered in the ED   ipratropium-albuterol 0.5 mg-3 mg(2.5 mg base)/3 mL Solution for Nebulization (3 mL Nebulization Given 11/28/22 0233)   methylPREDNISolone sod succ (SOLU-medrol) 125 mg/2 mL injection (125 mg Intravenous Given 11/28/22 0229)   ondansetron (ZOFRAN) 2 mg/mL injection (4 mg Intravenous Given 11/28/22 0229)   acetaminophen (TYLENOL) tablet (650 mg Oral Given 11/28/22 0313)   morphine 4 mg/mL injection (4 mg Intravenous Given 11/28/22 0341)   iohexol (OMNIPAQUE 350) infusion (75 mL Intravenous Given 11/28/22 0706)   diatrizoate meglumine & sodium oral solution (30 mL Oral Given 11/28/22 0706)   NS premix infusion (has no administration in time range)         PMHx:    Past Medical History:   Diagnosis Date    COPD (chronic obstructive pulmonary disease) (CMS HCC)     CVA (cerebrovascular accident) (CMS HCC)      History of MI (myocardial infarction)     HTN (hypertension)         PSHx:   Total Hysterectomy  C-section  Appendectomy   Allergies:    Allergies   Allergen Reactions    Vistaril [Hydroxyzine Hcl]  Other Adverse Reaction (Add comment)     Causes panic attack and jerking    Sulfa (Sulfonamides)     Aspirin Nausea/ Vomiting    Bactrim [Sulfamethoxazole-Trimethoprim] Nausea/ Vomiting    Codeine  Other Adverse Reaction (Add comment)     Make her lips feel like they are swollen when they are not swollen    Darvon [Propoxyphene] Itching    Social History  Social History     Tobacco Use    Smoking status: Some Days     Current packs/day: 0.50     Types: Cigarettes     Passive exposure: Never    Smokeless tobacco: Never   Vaping Use    Vaping status: Never Used   Substance Use Topics    Alcohol use: Never    Drug use: Never       Family History  Family Medical History:    None            Home Meds:  Prior to Admission medications    Medication Sig Start Date End Date Taking? Authorizing Provider   albuterol sulfate (PROVENTIL OR VENTOLIN OR PROAIR) 90 mcg/actuation Inhalation oral inhaler Take 1-2 Puffs by inhalation Every 6 hours as needed    Provider, Historical   ascorbic acid, vitamin C, (VITAMIN C) 500 mg Oral Tablet Take 1 Tablet (500 mg total) by mouth Once a day    Provider, Historical   aspirin (ECOTRIN) 81 mg Oral Tablet, Delayed Release (E.C.) Take 1 Tablet (81 mg total) by mouth Once a day 10/06/22   Ebbie Ridge, DO   budesonide-glycopyr-formoterol (BREZTRI AEROSPHERE) 160-9-4.8 mcg/actuation Inhalation HFA Aerosol Inhaler Take 2 Puffs by inhalation Twice daily    Provider, Historical   clopidogreL (PLAVIX) 75 mg Oral Tablet Take 1 Tablet (75 mg total) by mouth Once a day    Provider, Historical   cyanocobalamin (VITAMIN B 12) 1,000 mcg Oral Tablet Take 1 Tablet (1,000 mcg total) by mouth Once a day    Provider, Historical   doxycycline hyclate (VIBRAMYCIN) 100 mg Oral Capsule Take 1 Capsule (100 mg  total) by mouth Twice daily for 7 days 11/25/22 12/02/22  McDuffee, Elmyra Ricks, PA-C   FLUoxetine (PROZAC) 20 mg Oral Capsule Take 1 Capsule (20 mg total) by mouth Once a day 05/20/22   Lou Cal, PA-C   IPRATROPIUM 0.5 MG-ALBUTEROL 3 MG (2.5 MG BASE)/3 ML NEBULIZATION SOLN Take 3 mL by nebulization Every 6 hours as needed for Wheezing    Provider, Historical   loratadine (CLARITIN) 10 mg Oral Tablet Take 1 Tablet (10 mg total) by mouth Once a day    Provider, Historical   metoprolol tartrate (LOPRESSOR) 25 mg Oral Tablet Take 0.5 Tablets (12.5 mg total) by mouth Twice daily for 30 days 10/06/22 11/05/22  Ebbie Ridge, DO   nystatin (MYCOSTATIN) 100,000 unit/mL Oral Suspension Take 5 mL by mouth Four times a day    Provider, Historical   ondansetron (ZOFRAN ODT) 4 mg Oral Tablet, Rapid Dissolve Take 1 Tablet (4 mg total) by mouth Every 8 hours as needed for Nausea/Vomiting 11/28/22  Yes Petrarca, Robert, DO   Potassium Gluconate 2.5 mEq Oral Tablet Take 1 Tablet (2.5 mEq total) by mouth Once a day    Provider, Historical   predniSONE (DELTASONE) 10 mg Oral Tablet Take 1 Tablet (10 mg total) by mouth Once a day    Provider, Historical   predniSONE (DELTASONE) 20 mg Oral Tablet Take 1 Tablet (20 mg total) by mouth Once a day for 4 days 11/25/22 11/29/22  McDuffee, Elmyra Ricks, PA-C   rOPINIRole (REQUIP) 0.5 mg Oral Tablet Take 1 Tablet (0.5 mg total) by mouth Twice daily 05/03/22   Lou Cal, PA-C   rosuvastatin (CRESTOR) 20 mg Oral Tablet Take 1 Tablet (20 mg total) by mouth Every evening    Provider, Historical          ROS:   General: No fever or chills. No weight changes,  Admits to having fatigue and weakness.   HEENT: No headaches, dizziness, changes in vision, changes in hearing, or difficulty swallowing.    Skin:  No rashes, erythema or bruises.   Cardiac: No chest pain, palpitations, or arrhythmia.    Respiratory:  shortness of breath,nonproductive cough  GI: nausea, NO vomiting. No abdominal pain.   Urinary: No dysuria,  hematuria, or change in frequency.    Vascular: No edema.     Musculoskeletal: No muscle weakness, pain, or decreased range of motion.  Neurologic: No loss of sensation, numbness or tingling.   Endocrine: No heat or cold intolerance or polydipsia.   Psychiatric: No insomnia, depression or anxiety.      Results for orders placed or performed during the hospital encounter of 11/28/22 (from the past 24 hour(s))   URINALYSIS, MACROSCOPIC   Result Value Ref Range    COLOR Light Yellow Colorless, Light Yellow, Yellow    APPEARANCE Clear Clear    SPECIFIC GRAVITY 1.012 1.002 - 1.030    PH 7.5 5.0 - 9.0    LEUKOCYTES 75 (A) Negative, 100  WBCs/uL    NITRITE Negative Negative    PROTEIN 10 Negative, 10 , 20  mg/dL    GLUCOSE Negative Negative, 30  mg/dL    KETONES Negative Negative, Trace mg/dL    BILIRUBIN Negative Negative, 0.5 mg/dL    BLOOD 6.96 (A) Negative, 0.03 mg/dL    UROBILINOGEN Normal Normal mg/dL   URINALYSIS, MICROSCOPIC   Result Value Ref Range    RBCS 9 (H) <4 /hpf    WBCS 2 <6 /hpf    SQUAMOUS EPITHELIAL 1 <28 /hpf   ARTERIAL BLOOD GAS/LACTATE   Result Value Ref Range    PH (ARTERIAL) 7.42 7.35 - 7.45    PCO2 (ARTERIAL) 58 (H) 35 - 45 mm/Hg    BICARBONATE (ARTERIAL) 34.5 (H) 20.0 - 26.0 mmol/L    BASE EXCESS (ARTERIAL) 12.8 (H) 0.0 - 2.0 mmol/L    MET-HEMOGLOBIN 0.2 <=2.0 %    LACTATE      CARBOXYHEMOGLOBIN 2.1 (H) <=1.5 %    O2CT 15.3 %    %FIO2 (ARTERIAL) 28 %    PO2 (ARTERIAL) 67 (L) 80 - 100 mm/Hg    OXYHEMOGLOBIN 91.6 88.0 - 100.0 %    ALLEN TEST passed     DRAW SITE RT RAD    ECG 12 LEAD   Result Value Ref Range    Ventricular rate 117 BPM    Atrial Rate 117 BPM    PR Interval 124 ms    QRS Duration 82 ms    QT Interval 352 ms    QTC Calculation 491 ms    Calculated P Axis 81 degrees    Calculated R Axis 65 degrees    Calculated T Axis 75 degrees   C-REACTIVE PROTEIN(CRP),INFLAMMATION   Result Value Ref Range    C-REACTIVE PROTEIN (CRP) <0.5 0.1 - 0.5 mg/dL   TROPONIN-I   Result Value Ref Range     TROPONIN I 15 (H) <15 ng/L   CBC WITH DIFF   Result Value Ref Range    WBC 9.8 3.8 - 11.8 x10^3/uL    RBC 4.12 3.63 - 4.92 x10^6/uL    HGB 11.9 10.9 - 14.3 g/dL    HCT 29.5 28.4 - 13.2 %    MCV 85.3 75.5 - 95.3 fL    MCH 28.9 24.7 - 32.8 pg    MCHC 33.9 32.3 - 35.6 g/dL    RDW 44.0 10.2 - 72.5 %    PLATELETS 425 140 - 440 x10^3/uL    MPV 5.9 (L) 7.9 - 10.8 fL    NEUTROPHIL % 81 (H) 43 - 77 %    LYMPHOCYTE % 13 (L) 16 - 44 %    MONOCYTE % 5 5 - 13 %    EOSINOPHIL % 1 1 - 7 %    BASOPHIL % 0 0 - 1 %    NEUTROPHIL # 7.90 (H) 1.85 - 7.80 x10^3/uL  LYMPHOCYTE # 1.30 1.00 - 3.00 x10^3/uL    MONOCYTE # 0.50 0.30 - 1.00 x10^3/uL    EOSINOPHIL # 0.10 0.00 - 0.50 x10^3/uL    BASOPHIL # 0.00 0.00 - 0.10 x10^3/uL   B-TYPE NATRIURETIC PEPTIDE   Result Value Ref Range    BNP 195 (H) 1 - 100 pg/mL   COMPREHENSIVE METABOLIC PANEL, NON-FASTING   Result Value Ref Range    SODIUM 129 (L) 136 - 145 mmol/L    POTASSIUM 3.2 (L) 3.5 - 5.1 mmol/L    CHLORIDE 86 (L) 98 - 107 mmol/L    CO2 TOTAL 36 (H) 21 - 31 mmol/L    ANION GAP 7 4 - 13 mmol/L    BUN 12 7 - 25 mg/dL    CREATININE 0.27 2.53 - 1.30 mg/dL    BUN/CREA RATIO 17 6 - 22    ESTIMATED GFR 85 >59 mL/min/1.31m^2    ALBUMIN 4.6 3.5 - 5.7 g/dL    CALCIUM 9.7 8.6 - 66.4 mg/dL    GLUCOSE 403 (H) 74 - 109 mg/dL    ALKALINE PHOSPHATASE 82 34 - 104 U/L    ALT (SGPT) 12 7 - 52 U/L    AST (SGOT) 14 13 - 39 U/L    BILIRUBIN TOTAL 0.8 0.3 - 1.0 mg/dL    PROTEIN TOTAL 7.3 6.4 - 8.9 g/dL    ALBUMIN/GLOBULIN RATIO 1.7 (H) 0.8 - 1.4    OSMOLALITY, CALCULATED 260 (L) 270 - 290 mOsm/kg    CALCIUM, CORRECTED 9.2 8.9 - 10.8 mg/dL    GLOBULIN 2.7 (L) 2.9 - 5.4   LACTIC ACID LEVEL W/ REFLEX FOR LEVEL >2.0   Result Value Ref Range    LACTIC ACID 0.8 0.5 - 2.2 mmol/L   MAGNESIUM   Result Value Ref Range    MAGNESIUM 1.6 (L) 1.9 - 2.7 mg/dL   LIPASE   Result Value Ref Range    LIPASE 11 11 - 82 U/L   PT/INR   Result Value Ref Range    PROTHROMBIN TIME 11.1 9.8 - 12.7 seconds    INR 0.95 0.84 - 1.10   PTT  (PARTIAL THROMBOPLASTIN TIME)   Result Value Ref Range    APTT 32.3 25.0 - 38.0 seconds          Physical:  Filed Vitals:    11/28/22 1150 11/28/22 1316 11/28/22 1621 11/28/22 1713   BP: (!) 164/82  135/71    Pulse: 89  77    Resp: 20  (!) 21    Temp: 36.3 C (97.3 F)  36.7 C (98 F)    SpO2: 97% 99% 98% 98%      General: Patient is alert and oriented to person, place, and time. No acute distress. Communicates appropriately.   Head: Normocephalic and atraumatic.    Eyes: Pupils equally round and react to light and accommodate. Extraocular movements intact.  Conjunctiva normal. Sclerae are normal.    Nose: Nasal passages clear. Mucosa moist.    Throat: Moist oral mucosa. No erythema or exudate of the pharynx. Clear oropharynx.    Neck: Supple. No cervical lymphadenopathy or supraclavicular nodes detected. Trachea midline   Heart: Regular rate and rhythm. S1 & S2 present. No S3 or S4. No rubs, gallops, or murmurs appreciated.  Radial and dorsalis pedis pulses +2/4 bilaterally.  Brisk capillary refill.    Lungs: decreased breath sounds.  No wheezing or ronchi.    Abdomen: Soft, nontender, nondistended belly. Bowel sounds are present in all  four quadrants. No rigidity.  No guarding.  No ascites.   Extremities: No edema, cyanosis, or clubbing. Grossly moves all extremities.    Skin: Warm and dry without lesions. No ecchymosis noted.    Neurologic: Cranial nerves II through XII are grossly intact. Sensation to light touch is intact. Strength 5/5 in upper extremities and lower extremities bilaterally.    Genitourinary:  No urinary incontinence or Foley catheter   Psychiatric: Judgment and insight are intact. Mood and affect are appropriate for the situation.           Assessments:  Active Hospital Problems   (*Primary Problem)    Diagnosis    COPD exacerbation (CMS HCC)     Acute Exacerbation of COPD  Chronic respiratory failure with hypoxia  Ileus  Hypnatremia  Generalized anxiety disorder  Chronic HTN  Vitamin B12  deficiency  Restless leg syndrome  Hyperlipidemia  History of CVA and MI    Plan:  Admit patient to telemetry.  Treat with IV solu-medrol, duonebs, cefepime, tessalon Perles, mucinex and  incentive spirometer and Flutter valve.  Titrate oxygen to keep saturation 89-92%.  OOB to Chair TID  Will continue NG tube for now.  Repeat KUB in A.M.  Zofran for nausea  Monitor Hypokalemia.  Likely due to pulm. Disease.  Repeat BMP in a.m  Will continue 0.5mg  BID ativan for anxiety as well as fluoxetine 20 mg daily.  Continue ropinirole 0.5mg  for restless leg syndrome  Continue home dose of metoprolol for HTN  Continue home dose of  aspirin, Plavix due to history of CVA and MI  Place nicotine patch for nicotine dependence.    Any further intervention will be determined by hospital course.          Code status: No CPR  DVT prophylaxis: enoxaparin    Diet: DIET NPO - NOW EXCEPT CARDIAC MEDS WITH SIP OF WATER        Linus Orn, DO    [facility] MEDICINE HOSPITALIST

## 2022-11-28 NOTE — ED Triage Notes (Signed)
Called EMS for SOB since 23:00. 85% on 2L NC up on EMS arrival.     EMS gave neb.   3L NC   98% during triage

## 2022-11-28 NOTE — ED Nurses Note (Signed)
I HAVE CALLED REPORT TO 3E AND SPOKE TO AMY, PT WILL BE GOING TO ROOM 329B, I HAVE SPOKEN TO DR Reuel Boom AND MADE HIM AWARE THAT PT IS ASKING FOR HER ATIVAN 0.5MG . AND TO ADD THE HOME MEDS FOR HER BP MEDS, He SAID He WOULD TAKE A LOOK AT IT,

## 2022-11-28 NOTE — Care Plan (Signed)
Problem: Adult Inpatient Plan of Care  Goal: Plan of Care Review  Outcome: Ongoing (see interventions/notes)  Goal: Patient-Specific Goal (Individualized)  Outcome: Ongoing (see interventions/notes)  Goal: Absence of Hospital-Acquired Illness or Injury  Outcome: Ongoing (see interventions/notes)  Intervention: Identify and Manage Fall Risk  Recent Flowsheet Documentation  Taken 11/28/2022 1200 by Tracey Harries, RN  Safety Promotion/Fall Prevention:   activity supervised   toileting scheduled   safety round/check completed   nonskid shoes/slippers when out of bed  Taken 11/28/2022 1000 by Tracey Harries, RN  Safety Promotion/Fall Prevention:   activity supervised   toileting scheduled   safety round/check completed   nonskid shoes/slippers when out of bed  Intervention: Prevent Skin Injury  Recent Flowsheet Documentation  Taken 11/28/2022 1200 by Tracey Harries, RN  Body Position: supine, head elevated  Goal: Optimal Comfort and Wellbeing  Outcome: Ongoing (see interventions/notes)  Goal: Rounds/Family Conference  Outcome: Ongoing (see interventions/notes)     Problem: Skin Injury Risk Increased  Goal: Skin Health and Integrity  Outcome: Ongoing (see interventions/notes)  Intervention: Optimize Skin Protection  Recent Flowsheet Documentation  Taken 11/28/2022 1200 by Tracey Harries, RN  Head of Bed [facility]) Positioning: HOB elevated  Taken 11/28/2022 1000 by Tracey Harries, RN  Head of Bed [facility]) Positioning: HOB elevated     Problem: Fall Injury Risk  Goal: Absence of Fall and Fall-Related Injury  Outcome: Ongoing (see interventions/notes)  Intervention: Identify and Manage Contributors  Recent Flowsheet Documentation  Taken 11/28/2022 1000 by Tracey Harries, RN  Medication Review/Management: medications reviewed  Intervention: Promote Injury-Free Environment  Recent Flowsheet Documentation  Taken 11/28/2022 1200 by Tracey Harries, RN  Safety Promotion/Fall Prevention:   activity supervised   toileting scheduled   safety round/check completed    nonskid shoes/slippers when out of bed  Taken 11/28/2022 1000 by Tracey Harries, RN  Safety Promotion/Fall Prevention:   activity supervised   toileting scheduled   safety round/check completed   nonskid shoes/slippers when out of bed     Problem: Intestinal Obstruction  Goal: Optimal Bowel Function  Outcome: Ongoing (see interventions/notes)  Intervention: Promote Bowel Function  Recent Flowsheet Documentation  Taken 11/28/2022 1200 by Tracey Harries, RN  Positioning Assistance: Positioned/repositioned independently  Body Position: supine, head elevated  Head of Bed [facility]) Positioning: HOB elevated  Taken 11/28/2022 1000 by Tracey Harries, RN  Head of Bed [facility]) Positioning: HOB elevated  Goal: Fluid and Electrolyte Balance  Outcome: Ongoing (see interventions/notes)  Goal: Absence of Infection Signs and Symptoms  Outcome: Ongoing (see interventions/notes)  Goal: Optimize Nutrition Status  Outcome: Ongoing (see interventions/notes)  Goal: Optimal Pain Control and Function  Outcome: Ongoing (see interventions/notes)     Problem: Oral Intake Inadequate  Goal: Improved Oral Intake  Outcome: Ongoing (see interventions/notes)     Problem: Pain Acute  Goal: Optimal Pain Control and Function  Outcome: Ongoing (see interventions/notes)  Intervention: Prevent or Manage Pain  Recent Flowsheet Documentation  Taken 11/28/2022 1000 by Tracey Harries, RN  Medication Review/Management: medications reviewed     Problem: Fever  Goal: Fever: Plan of Care  Outcome: Ongoing (see interventions/notes)     Problem: Breathing Pattern Ineffective  Goal: Effective Breathing Pattern  Outcome: Ongoing (see interventions/notes)  Intervention: Promote Improved Breathing Pattern  Recent Flowsheet Documentation  Taken 11/28/2022 1200 by Tracey Harries, RN  Head of Bed [facility]) Positioning: HOB elevated  Taken 11/28/2022 1000 by Tracey Harries, RN  Head of Bed [facility]) Positioning: [facility] elevated      Patient has  NG tube at Low intermittent suction. Patient denies ABD pain. But  has distention noted. Patient Ax4. Meds whole. BP slightly elevated. NO fevers. Discharge needs ongoing. Electrolytes low and receiving IV replacement.

## 2022-11-28 NOTE — ED Nurses Note (Addendum)
Pt presents to the ER via EMS with c/o increased SOB, O2 sat in the 80's at home, and swelling in BLE. Pt reports hx of COPD and CHF. Pt states she has been wearing 1.5L via NC O2 at home. Pt has 20g PIV in left wrist inserted PTA by EMS. Pt placed on cardiac, BP and pulse ox monitors. Assessment complete. Awaiting further orders

## 2022-11-29 ENCOUNTER — Inpatient Hospital Stay (HOSPITAL_COMMUNITY): Payer: Medicare Other

## 2022-11-29 DIAGNOSIS — R140 Abdominal distension (gaseous): Secondary | ICD-10-CM

## 2022-11-29 DIAGNOSIS — I517 Cardiomegaly: Secondary | ICD-10-CM

## 2022-11-29 DIAGNOSIS — R000 Tachycardia, unspecified: Secondary | ICD-10-CM

## 2022-11-29 LAB — COMPREHENSIVE METABOLIC PANEL, NON-FASTING
ALBUMIN/GLOBULIN RATIO: 1.7 — ABNORMAL HIGH (ref 0.8–1.4)
ALBUMIN: 3.9 g/dL (ref 3.5–5.7)
ALKALINE PHOSPHATASE: 68 U/L (ref 34–104)
ALT (SGPT): 10 U/L (ref 7–52)
ANION GAP: 4 mmol/L (ref 4–13)
AST (SGOT): 17 U/L (ref 13–39)
BILIRUBIN TOTAL: 0.5 mg/dL (ref 0.3–1.0)
BUN/CREA RATIO: 22 (ref 6–22)
BUN: 15 mg/dL (ref 7–25)
CALCIUM, CORRECTED: 9 mg/dL (ref 8.9–10.8)
CALCIUM: 8.9 mg/dL (ref 8.6–10.3)
CHLORIDE: 89 mmol/L — ABNORMAL LOW (ref 98–107)
CO2 TOTAL: 38 mmol/L — ABNORMAL HIGH (ref 21–31)
CREATININE: 0.69 mg/dL (ref 0.60–1.30)
ESTIMATED GFR: 87 mL/min/{1.73_m2} (ref >59)
GLOBULIN: 2.3 — ABNORMAL LOW (ref 2.9–5.4)
GLUCOSE: 150 mg/dL — ABNORMAL HIGH (ref 74–109)
OSMOLALITY, CALCULATED: 266 mOsm/kg — ABNORMAL LOW (ref 270–290)
POTASSIUM: 4.6 mmol/L (ref 3.5–5.1)
PROTEIN TOTAL: 6.2 g/dL — ABNORMAL LOW (ref 6.4–8.9)
SODIUM: 131 mmol/L — ABNORMAL LOW (ref 136–145)

## 2022-11-29 LAB — CBC WITH DIFF
BASOPHIL #: 0 10*3/uL (ref 0.00–0.10)
BASOPHIL %: 0 % (ref 0–1)
EOSINOPHIL #: 0 10*3/uL (ref 0.00–0.50)
EOSINOPHIL %: 0 % — ABNORMAL LOW (ref 1–7)
HCT: 33 % (ref 31.2–41.9)
HGB: 11.1 g/dL (ref 10.9–14.3)
LYMPHOCYTE #: 0.6 10*3/uL — ABNORMAL LOW (ref 1.00–3.00)
LYMPHOCYTE %: 8 % — ABNORMAL LOW (ref 16–44)
MCH: 28.9 pg (ref 24.7–32.8)
MCHC: 33.7 g/dL (ref 32.3–35.6)
MCV: 85.6 fL (ref 75.5–95.3)
MONOCYTE #: 0.3 10*3/uL (ref 0.30–1.00)
MONOCYTE %: 4 % — ABNORMAL LOW (ref 5–13)
MPV: 6.1 fL — ABNORMAL LOW (ref 7.9–10.8)
NEUTROPHIL #: 6.6 10*3/uL (ref 1.85–7.80)
NEUTROPHIL %: 88 % — ABNORMAL HIGH (ref 43–77)
PLATELETS: 403 10*3/uL (ref 140–440)
RBC: 3.86 10*6/uL (ref 3.63–4.92)
RDW: 14 % (ref 12.3–17.7)
WBC: 7.6 10*3/uL (ref 3.8–11.8)

## 2022-11-29 LAB — ECG 12 LEAD
Atrial Rate: 117 {beats}/min
Calculated P Axis: 81 degrees
Calculated R Axis: 65 degrees
Calculated T Axis: 75 degrees
PR Interval: 124 ms
QRS Duration: 82 ms
QT Interval: 352 ms
QTC Calculation: 491 ms
Ventricular rate: 117 {beats}/min

## 2022-11-29 LAB — ADULT ROUTINE BLOOD CULTURE, SET OF 2 BOTTLES (BACTERIA AND YEAST): BLOOD CULTURE, ROUTINE: NO GROWTH

## 2022-11-29 MED ORDER — RAMELTEON 8 MG TABLET
8.0000 mg | ORAL_TABLET | Freq: Every evening | ORAL | Status: DC
Start: 2022-11-29 — End: 2022-12-02
  Administered 2022-11-29 – 2022-12-01 (×3): 8 mg via ORAL
  Filled 2022-11-29 (×3): qty 1

## 2022-11-29 MED ORDER — LORAZEPAM 0.5 MG TABLET
0.5000 mg | ORAL_TABLET | ORAL | Status: AC
Start: 2022-11-29 — End: 2022-11-29
  Administered 2022-11-29: 0.5 mg via ORAL
  Filled 2022-11-29: qty 1

## 2022-11-29 MED ORDER — ONDANSETRON HCL (PF) 4 MG/2 ML INJECTION SOLUTION
4.0000 mg | Freq: Four times a day (QID) | INTRAMUSCULAR | Status: DC | PRN
Start: 2022-11-29 — End: 2022-12-02
  Administered 2022-11-29: 4 mg via INTRAVENOUS
  Filled 2022-11-29: qty 2

## 2022-11-29 NOTE — Nurses Notes (Signed)
Provider informed of patient's request for additional anxiety medication.

## 2022-11-29 NOTE — Consults (Signed)
[facility]  Palliative Care Nurse Practitioner  Consult Note    Michele Fitzgerald, Michele Fitzgerald, 82 y.o. female  Date of Birth:  Jan 08, 1941  MRN: [id number]  Admit Date: 11/28/2022   Attending: Hospitalist  Myra Gianotti Hill-Hurt, PA-C   Reason for Consult: Goal Coordination  Requesting provider: Linus Orn, DO     Chief Complaint: SOB    HPI:  Michele Fitzgerald is a 82 y.o. female who presented to the emergency room with complaints of shortness of breath and generalized weakness for the past week. She states that she was seen in the ER approximately 4 days ago for the same thing. She reports of being a chronic smoker for many years and she is currently smoking close to a pack a day. She states that she knows that she needs to stop, but at this point in her life she feels like the damage has been done. Patient's PMH includes but is not limited to COPD, Respiratory failure, CVA, and HTN. CXR did not show any acute findings. KUB reveals possible enteritis and clinical follow-up is recommended. CT Abdomen Pelvis w IV contrast showed gastric and proximal small bowel distention with air-fluid levels wit normal caliber to the distal small bowel, which could be due to an ileus. KUB revealed no complete mechanical obstruction, NG tube is in good position, small bowel loop caliber has improved somewhat, and a nonobstructive bowel gas pattern is noted. Labs: NA 131, CL 89, CO2 38, GLUCOSE 150, TOT PROT 6.2, A/G RATIO 1.7, MAG 1.6. Patient admitted and treated for Acute exacerbation of COPD, Partial bowel obstruction, Abdominal pain, N/V, and tobacco use.     Subjective: Patient resting in bed, she states that she is feeling better and would like for the "nose tube" to be removed.     Review of Systems:  ROS: Other than ROS in the HPI, all other systems were negative.    Past Medical History:   Diagnosis Date    COPD (chronic obstructive pulmonary disease) (CMS HCC)     CVA (cerebrovascular accident) (CMS HCC)      History of MI (myocardial infarction)     HTN (hypertension)      Past Surgical History:   Procedure Laterality Date    HX APPENDECTOMY      HX CESAREAN SECTION      HX TAH AND BSO        Family Medical History:    None       Social History     Socioeconomic History    Marital status: Widowed   Tobacco Use    Smoking status: Some Days     Current packs/day: 0.50     Types: Cigarettes     Passive exposure: Never    Smokeless tobacco: Never   Vaping Use    Vaping status: Never Used   Substance and Sexual Activity    Alcohol use: Never    Drug use: Never     Social Determinants of Health     Financial Resource Strain: Low Risk  (11/28/2022)    Financial Resource Strain     SDOH Financial: No   Transportation Needs: Low Risk  (11/28/2022)    Transportation Needs     SDOH Transportation: No   Social Connections: Medium Risk (11/28/2022)    Social Connections     SDOH Social Isolation: 3 to 5 times a week   Housing Stability: Low Risk  (11/28/2022)    Housing Stability  SDOH Housing Situation: I have housing.     SDOH Housing Worry: No     Current Outpatient Medications   Medication Instructions    albuterol sulfate (PROVENTIL OR VENTOLIN OR PROAIR) 90 mcg/actuation Inhalation oral inhaler 1-2 Puffs, Inhalation, EVERY 6 HOURS PRN    ascorbic acid (vitamin C) (VITAMIN C) 500 mg, Oral, DAILY    aspirin (ECOTRIN) 81 mg, Oral, DAILY    budesonide-glycopyr-formoterol (BREZTRI AEROSPHERE) 160-9-4.8 mcg/actuation Inhalation HFA Aerosol Inhaler 2 Puffs, Inhalation, 2 TIMES DAILY    clopidogreL (PLAVIX) 75 mg, Oral, DAILY    cyanocobalamin (VITAMIN B 12) 1,000 mcg, Oral, DAILY    doxycycline hyclate (VIBRAMYCIN) 100 mg, Oral, 2 TIMES DAILY    FLUoxetine (PROZAC) 20 mg, Oral, DAILY    IPRATROPIUM 0.5 MG-ALBUTEROL 3 MG (2.5 MG BASE)/3 ML NEBULIZATION SOLN 3 mL, Nebulization, EVERY 6 HOURS PRN    loratadine (CLARITIN) 10 mg, Oral, DAILY    metoprolol tartrate (LOPRESSOR) 12.5 mg, Oral, 2 TIMES DAILY    nystatin (MYCOSTATIN) 100,000  unit/mL Oral Suspension 5 mL, Oral, 4 TIMES DAILY    ondansetron (ZOFRAN ODT) 4 mg, Oral, EVERY 8 HOURS PRN    Potassium Gluconate 2.5 mEq Oral Tablet 2.5 mEq, Oral, DAILY    predniSONE (DELTASONE) 10 mg, Oral, DAILY    predniSONE (DELTASONE) 20 mg, Oral, DAILY    rOPINIRole (REQUIP) 0.5 mg, Oral, 2 TIMES DAILY    rosuvastatin (CRESTOR) 20 mg, Oral, EVERY EVENING      Allergies   Allergen Reactions    Vistaril [Hydroxyzine Hcl]  Other Adverse Reaction (Add comment)     Causes panic attack and jerking    Sulfa (Sulfonamides)     Aspirin Nausea/ Vomiting    Bactrim [Sulfamethoxazole-Trimethoprim] Nausea/ Vomiting    Codeine  Other Adverse Reaction (Add comment)     Make her lips feel like they are swollen when they are not swollen    Darvon [Propoxyphene] Itching      Physical Exam:  Constitutional: appears in good health, comfortable, NAD  Respiratory:  some conversational dyspnea noted, expiratory wheezes noted bilaterally  Cardiovascular:    RRR  Gastrointestinal: non-distended, Soft, non-tender, Bowel sounds normal  Extremities: no ROM deficits, no e/c/c  Integumentary:  Skin warm and dry  Neurologic: Grossly normal. Alert and oriented x3    BP (!) 154/71   Pulse 78   Temp 36.6 C (97.8 F)   Resp (!) 38   Ht 1.524 m (5')   Wt 59 kg (130 lb)   SpO2 96%   BMI 25.39 kg/m      Pain: Numeric 0     Labs:  Lab Results Today:    Results for orders placed or performed during the hospital encounter of 11/28/22 (from the past 24 hour(s))   COMPREHENSIVE METABOLIC PANEL, NON-FASTING   Result Value Ref Range    SODIUM 131 (L) 136 - 145 mmol/L    POTASSIUM 4.6 3.5 - 5.1 mmol/L    CHLORIDE 89 (L) 98 - 107 mmol/L    CO2 TOTAL 38 (H) 21 - 31 mmol/L    ANION GAP 4 4 - 13 mmol/L    BUN 15 7 - 25 mg/dL    CREATININE 9.56 2.13 - 1.30 mg/dL    BUN/CREA RATIO 22 6 - 22    ESTIMATED GFR 87 >59 mL/min/1.27m^2    ALBUMIN 3.9 3.5 - 5.7 g/dL    CALCIUM 8.9 8.6 - 08.6 mg/dL    GLUCOSE 578 (H) 74 -  109 mg/dL    ALKALINE PHOSPHATASE 68  34 - 104 U/L    ALT (SGPT) 10 7 - 52 U/L    AST (SGOT) 17 13 - 39 U/L    BILIRUBIN TOTAL 0.5 0.3 - 1.0 mg/dL    PROTEIN TOTAL 6.2 (L) 6.4 - 8.9 g/dL    ALBUMIN/GLOBULIN RATIO 1.7 (H) 0.8 - 1.4    OSMOLALITY, CALCULATED 266 (L) 270 - 290 mOsm/kg    CALCIUM, CORRECTED 9.0 8.9 - 10.8 mg/dL    GLOBULIN 2.3 (L) 2.9 - 5.4   CBC WITH DIFF   Result Value Ref Range    WBC 7.6 3.8 - 11.8 x10^3/uL    RBC 3.86 3.63 - 4.92 x10^6/uL    HGB 11.1 10.9 - 14.3 g/dL    HCT 51.8 84.1 - 66.0 %    MCV 85.6 75.5 - 95.3 fL    MCH 28.9 24.7 - 32.8 pg    MCHC 33.7 32.3 - 35.6 g/dL    RDW 63.0 16.0 - 10.9 %    PLATELETS 403 140 - 440 x10^3/uL    MPV 6.1 (L) 7.9 - 10.8 fL    NEUTROPHIL % 88 (H) 43 - 77 %    LYMPHOCYTE % 8 (L) 16 - 44 %    MONOCYTE % 4 (L) 5 - 13 %    EOSINOPHIL % 0 (L) 1 - 7 %    BASOPHIL % 0 0 - 1 %    NEUTROPHIL # 6.60 1.85 - 7.80 x10^3/uL    LYMPHOCYTE # 0.60 (L) 1.00 - 3.00 x10^3/uL    MONOCYTE # 0.30 0.30 - 1.00 x10^3/uL    EOSINOPHIL # 0.00 0.00 - 0.50 x10^3/uL    BASOPHIL # 0.00 0.00 - 0.10 x10^3/uL     Imaging Studies:  CT ABDOMEN PELVIS W IV CONTRAST    Result Date: 11/28/2022  Impression 1. GASTRIC AND PROXIMAL SMALL BOWEL DISTENTION WITH AIR-FLUID LEVELS WITH NORMAL CALIBER TO THE DISTAL SMALL BOWEL. THIS COULD BE DUE TO AN ILEUS. AN EARLY OR PARTIAL SMALL BOWEL OBSTRUCTION IS NOT EXCLUDED 2. NO OTHER SIGNIFICANT FINDINGS One or more dose reduction techniques were used (e.g., Automated exposure control, adjustment of the mA and/or kV according to patient size, use of iterative reconstruction technique). Radiologist location ID: [id number]     XR AP MOBILE CHEST    Result Date: 11/28/2022  Impression NO ACUTE FINDINGS. Radiologist location ID: [id number]     XR KUB AND UPRIGHT ABDOMEN    Result Date: 11/28/2022  Impression At this time the bowel gas pattern appears more consistent with process such as enteritis No free air Continued clinical follow-up recommended Radiologist location ID: [id number]    Images and  Reports reviewed to current date.    ACP: Michele Fitzgerald  reports that she makes all of her medical decisions and she declined any information regarding MPOA or HCS.           Code Status: DNR      GOC: The patient states that she lives alone and she is capable of providing for her ADL's. She stated that her family lives behind her and they always bring her food when they cook (several times during the day), she reports that her family and friends assist with her transportation needs. She states that she has a walker, cane, and wheelchair at home, but seldom needs to use them. We discussed COPD, Respiratory failure, smoking cessation, avoiding known respiratory triggers, and education was provided along with the  possible symptom trajectories and their potential impact on her overall health status. We explored the relevance of following a heart healthy diet, medication compliance, and when to follow-up with PCP accordingly. Patient agreed to OP Palliative [facility] upon discharge. I gave her a business card and informed her to call the office to set up a date and time for her first community visit.            PPS prior to hospitalization: 60%    PPS currently: 50%     Persons present and participating in discussion: Patient     Was the conversation voluntary? Yes     Patient Disposition/Discharge needs: Patient agreed to OP Palliative Services upon discharge.              ACP/GOC time:     33 minutes     Total visit time:     52 minutes including ACP/GOC time, Face to face, reviewing medical records, and documentation time.     Thank you for this consult.     Frederich Cha [place] Sarinana MSN, Engineer, mining. FNP-BC  Palliative Care Nurse Practitioner    This note may have been partially generated using M[employer] Fluency Direct system and there may be some incorrect words, spellings, and punctuation that were not noted in checking the note before saving.

## 2022-11-29 NOTE — Respiratory Therapy (Signed)
Patient not interested in smoking cessation at this time.

## 2022-11-29 NOTE — Progress Notes (Signed)
[place] MEDICINE [facility]    HOSPITALIST PROGRESS NOTE    Michele Fitzgerald  Date of service: 11/29/2022  Date of Admission:  11/28/2022  Hospital Day:  LOS: 1 day     Subjective:   Patient seen in follow up for AECOPD, ileus/early SBO.  Feeling better overall today. Tolerating clear liquid diet.  Admits to passing gas.  No BM.  Breathing better today.  Still with some shortness of breath.     A focused review of symptoms was performed and is negative unless specified in HPI/subjective.    Vital Signs:  Filed Vitals:    11/29/22 1106 11/29/22 1308 11/29/22 1651 11/29/22 1700   BP: (!) 151/69  (!) 140/67    Pulse: 73  82    Resp: (!) 22  20    Temp: 36.9 C (98.5 F)  36.6 C (97.9 F)    SpO2: 97% 93% 93% 92%        Physical Exam:  GENERAL: No acute distress, Resting comfortably, Alert and oriented x3    SKIN: warm and dry.    CARDIAC: S1 and S2, no murmurs, clicks, or gallops. No S3.    LUNGS: Clear to auscultation bilaterally. No wheezes or rhonchi. No accessory muscle use noted.    GI: Abdomen soft, nontender. Good bowel sounds x 4. No organomegaly appreciated.    GU: No suprapubic tenderness. No costovertebral tenderness.    EXTREMITIES: No pedal edema noted. Distal pulses equal bilaterally.    NEUROLOGICAL: Cranial nerves grossly intact. No gross focal deficit.     Current Medications:  acetaminophen (TYLENOL) tablet, 650 mg, Oral, Q4H PRN  aluminum-magnesium hydroxide-simethicone (MAG-AL PLUS) 200-200-20 mg per 5 mL oral liquid, 15 mL, Oral, 4x/day PRN  aluminum-magnesium hydroxide-simethicone (MAG-AL PLUS) 200-200-20 mg per 5 mL oral liquid, 30 mL, Oral, Q4H PRN  aspirin chewable tablet 81 mg, 81 mg, Oral, Daily  benzonatate (TESSALON) capsule, 100 mg, Oral, Q8H PRN  cefepime (MAXIPIME) 2 g in NS 100 mL IVPB minibag, 2 g, Intravenous, Q12H  clopidogrel (PLAVIX) 75 mg tablet, 75 mg, Oral, Daily  D5W 1/2 NS 1000 mL with potassium chloride 20 mEq premix infusion, , Intravenous,  Continuous  enoxaparin PF (LOVENOX) 40 mg/0.4 mL SubQ injection, 40 mg, Subcutaneous, Q24H  FLUoxetine (PROzac) capsule, 20 mg, Oral, Daily  guaiFENesin 100mg  per 5mL oral liquid - for cough (expectorant), 200 mg, Oral, Q4H PRN  ipratropium-albuterol 0.5 mg-3 mg(2.5 mg base)/3 mL Solution for Nebulization, 3 mL, Nebulization, 4x/day  ipratropium-albuterol 0.5 mg-3 mg(2.5 mg base)/3 mL Solution for Nebulization, 3 mL, Nebulization, Q4H PRN  LORazepam (ATIVAN) tablet, 0.5 mg, Oral, 2x/day  methylPREDNISolone sod succ (SOLU-medrol) 125 mg/2 mL injection, 62.5 mg, Intravenous, Q6H  metoprolol tartrate (LOPRESSOR) tablet, 12.5 mg, Oral, 2x/day  ondansetron (ZOFRAN) 2 mg/mL injection, 4 mg, Intravenous, Q8H PRN  polyethylene glycol (MIRALAX) oral packet, 17 g, Oral, Daily  rOPINIRole (REQUIP) tablet, 0.5 mg, Oral, 2x/day  sennosides-docusate sodium (SENOKOT-S) 8.6-50mg  per tablet, 1 Tablet, Oral, 2x/day        Labs:  CBC:     7.6 (09/03 0250) \   11.1 (09/03 0250) /   403 (09/03 0250)      / 33.0 (09/03 0250) \          BMP:   131* (09/03 0250) 89* (09/03 0250) 15 (09/03 0250)    /     150* (09/03 0250)   4.6 (09/03 0250) 38* (09/03 0250) 0.69 (09/03 0250) \  Recent Results (from the past 24 hour(s))   COMPREHENSIVE METABOLIC PANEL, NON-FASTING    Collection Time: 11/29/22  2:50 AM   Result Value    ALKALINE PHOSPHATASE 68    ALT (SGPT) 10    AST (SGOT) 17          Radiology:  XR KUB AND UPRIGHT ABDOMEN    Result Date: 11/28/2022  Impression Oral contrast from CT scan the colon indicating no complete mechanical obstruction. NG tube is in good position Small bowel loop caliber has improved somewhat Radiologist location ID: [id number]     CT ABDOMEN PELVIS W IV CONTRAST    Result Date: 11/28/2022  Impression 1. GASTRIC AND PROXIMAL SMALL BOWEL DISTENTION WITH AIR-FLUID LEVELS WITH NORMAL CALIBER TO THE DISTAL SMALL BOWEL. THIS COULD BE DUE TO AN ILEUS. AN EARLY OR PARTIAL SMALL BOWEL OBSTRUCTION IS NOT EXCLUDED  2. NO OTHER SIGNIFICANT FINDINGS One or more dose reduction techniques were used (e.g., Automated exposure control, adjustment of the mA and/or kV according to patient size, use of iterative reconstruction technique). Radiologist location ID: [id number]     XR AP MOBILE CHEST    Result Date: 11/28/2022  Impression NO ACUTE FINDINGS. Radiologist location ID: [id number]     XR KUB AND UPRIGHT ABDOMEN    Result Date: 11/28/2022  Impression At this time the bowel gas pattern appears more consistent with process such as enteritis No free air Continued clinical follow-up recommended Radiologist location ID: [id number]       The laboratory results, imaging results and other diagnostic results were reviewed in the EMR.    Assessment/ Plan:   Active Hospital Problems   (*Primary Problem)    Diagnosis    Acute exacerbation of chronic obstructive pulmonary disease (CMS HCC)     AECOPD  Chronic respiratory failure with hypoxia  Ileus    Continue duonebs, steroids, antibiotics, IS, flutter valve.  OOB to chair.  Titrate oxygen to keep saturation 89-92%.    Clamp NG tube and repeat KUB.  If improved will remove NG tube and advance diet as tolerated.    Continue bowel regimen.  Will add reglan if KUB worse when clamped.      MY ORDERS LAST 24 (24h ago, onward)       Start     Ordered    11/30/22 0530  CBC/DIFF  0530 - AM DRAW (labs only)         11/29/22 0720    11/30/22 0530  COMPREHENSIVE METABOLIC PANEL, NON-FASTING  0530 - AM DRAW (labs only)         11/29/22 0720    11/29/22 1712  XR KUB AND UPRIGHT ABDOMEN  ONE TIME (IMAGING ONLY)         11/29/22 1712    11/29/22 0915  MISCELLANEOUS MD/DO TO NURSE  UNTIL DISCONTINUED        Comments: Please verify home medications with her pharmacy    11/29/22 0912    11/29/22 0730  DIET CLEAR LIQUID Do you want to initiate MNT Protocol? Yes  ALL MEALS         11/29/22 0720    11/29/22 0730  MISCELLANEOUS MD/DO TO NURSE  UNTIL DISCONTINUED        Comments: Clamp NG tube    11/29/22 0720     11/29/22 0530  CBC WITH DIFF  PROCEDURE ONCE         11/29/22 0016    11/28/22 2000  DIET NPO - NOW EXCEPT  CARDIAC MEDS WITH SIP OF WATER, EXCEPT ICE CHIPS  (NPO/EXCEPT CONTINUOUS TUBE FEED PANEL)  ALL MEALS,   Status:  Canceled         11/28/22 2000                    DVT/PE Prophylaxis: Enoxaparin    Disposition Planning: Home discharge      On the day of the encounter, a total of  35 minutes was spent on this patient encounter including review of historical information, examination, documentation and post-visit activities. The time documented excludes procedural time.     Linus Orn, DO FACP [facility]  11/29/2022  [place] MEDICINE HOSPITALIST

## 2022-11-30 ENCOUNTER — Inpatient Hospital Stay (HOSPITAL_COMMUNITY): Payer: Medicare Other

## 2022-11-30 DIAGNOSIS — Z79899 Other long term (current) drug therapy: Secondary | ICD-10-CM

## 2022-11-30 LAB — MANUAL DIFFERENTIAL
LYMPHOCYTE %: 6 % — ABNORMAL LOW (ref 25–45)
LYMPHOCYTE ABSOLUTE: 0.58 10*3/uL — ABNORMAL LOW (ref 1.10–5.00)
LYMPHOCYTES MANUAL: 6
METAMYELOCYTE %: 1 %
METAMYELOCYTE ABSOLUTE: 0.1 10*3/uL
METAMYELOCYTES MANUAL: 1
MONOCYTE %: 2 % (ref 0–12)
MONOCYTE ABSOLUTE: 0.19 10*3/uL (ref 0.00–1.30)
MONOCYTES MANUAL: 2
NEUTROPHIL %: 91 % — ABNORMAL HIGH (ref 40–76)
NEUTROPHIL ABSOLUTE: 8.83 10*3/uL — ABNORMAL HIGH (ref 1.80–8.40)
NEUTROPHILS MANUAL: 91
PLATELET MORPHOLOGY COMMENT: NORMAL
TOTAL CELLS COUNTED [#] IN BLOOD: 100
WBC: 9.7 10*3/uL

## 2022-11-30 LAB — COMPREHENSIVE METABOLIC PANEL, NON-FASTING
ALBUMIN/GLOBULIN RATIO: 1.7 — ABNORMAL HIGH (ref 0.8–1.4)
ALBUMIN: 3.8 g/dL (ref 3.5–5.7)
ALKALINE PHOSPHATASE: 62 U/L (ref 34–104)
ALT (SGPT): 11 U/L (ref 7–52)
ANION GAP: 7 mmol/L (ref 4–13)
AST (SGOT): 21 U/L (ref 13–39)
BILIRUBIN TOTAL: 0.6 mg/dL (ref 0.3–1.0)
BUN/CREA RATIO: 31 — ABNORMAL HIGH (ref 6–22)
BUN: 23 mg/dL (ref 7–25)
CALCIUM, CORRECTED: 8.9 mg/dL (ref 8.9–10.8)
CALCIUM: 8.7 mg/dL (ref 8.6–10.3)
CHLORIDE: 87 mmol/L — ABNORMAL LOW (ref 98–107)
CO2 TOTAL: 36 mmol/L — ABNORMAL HIGH (ref 21–31)
CREATININE: 0.75 mg/dL (ref 0.60–1.30)
ESTIMATED GFR: 80 mL/min/{1.73_m2} (ref >59)
GLOBULIN: 2.3 — ABNORMAL LOW (ref 2.9–5.4)
GLUCOSE: 166 mg/dL — ABNORMAL HIGH (ref 74–109)
OSMOLALITY, CALCULATED: 268 mOsm/kg — ABNORMAL LOW (ref 270–290)
POTASSIUM: 4.4 mmol/L (ref 3.5–5.1)
PROTEIN TOTAL: 6.1 g/dL — ABNORMAL LOW (ref 6.4–8.9)
SODIUM: 130 mmol/L — ABNORMAL LOW (ref 136–145)

## 2022-11-30 LAB — CBC WITH DIFF
HCT: 32.2 % (ref 31.2–41.9)
HGB: 11 g/dL (ref 10.9–14.3)
MCH: 29.3 pg (ref 24.7–32.8)
MCHC: 34.1 g/dL (ref 32.3–35.6)
MCV: 85.7 fL (ref 75.5–95.3)
MPV: 6.1 fL — ABNORMAL LOW (ref 7.9–10.8)
PLATELETS: 387 10*3/uL (ref 140–440)
RBC: 3.75 10*6/uL (ref 3.63–4.92)
RDW: 13.9 % (ref 12.3–17.7)
WBC: 9.7 10*3/uL (ref 3.8–11.8)

## 2022-11-30 LAB — BASIC METABOLIC PANEL
ANION GAP: 2 mmol/L — ABNORMAL LOW (ref 4–13)
BUN/CREA RATIO: 26 — ABNORMAL HIGH (ref 6–22)
BUN: 18 mg/dL (ref 7–25)
CALCIUM: 8.7 mg/dL (ref 8.6–10.3)
CHLORIDE: 92 mmol/L — ABNORMAL LOW (ref 98–107)
CO2 TOTAL: 37 mmol/L — ABNORMAL HIGH (ref 21–31)
CREATININE: 0.68 mg/dL (ref 0.60–1.30)
ESTIMATED GFR: 87 mL/min/{1.73_m2} (ref >59)
GLUCOSE: 167 mg/dL — ABNORMAL HIGH (ref 74–109)
OSMOLALITY, CALCULATED: 268 mOsm/kg — ABNORMAL LOW (ref 270–290)
POTASSIUM: 3.8 mmol/L (ref 3.5–5.1)
SODIUM: 131 mmol/L — ABNORMAL LOW (ref 136–145)

## 2022-11-30 LAB — URINE CULTURE,ROUTINE: URINE CULTURE: >100000 — AB

## 2022-11-30 MED ORDER — METHYLPREDNISOLONE SOD SUCCINATE 40 MG/ML SOLUTION FOR INJ. WRAPPER
40.0000 mg | Freq: Four times a day (QID) | INTRAMUSCULAR | Status: DC
Start: 2022-11-30 — End: 2022-12-01
  Administered 2022-11-30 – 2022-12-01 (×4): 40 mg via INTRAVENOUS
  Filled 2022-11-30 (×4): qty 1

## 2022-11-30 MED ORDER — ATORVASTATIN 40 MG TABLET
40.0000 mg | ORAL_TABLET | Freq: Every evening | ORAL | Status: DC
Start: 2022-11-30 — End: 2022-12-02
  Administered 2022-11-30 – 2022-12-01 (×2): 40 mg via ORAL
  Filled 2022-11-30 (×2): qty 1

## 2022-11-30 MED ORDER — LORAZEPAM 0.5 MG TABLET
0.5000 mg | ORAL_TABLET | Freq: Three times a day (TID) | ORAL | Status: DC
Start: 2022-11-30 — End: 2022-12-02
  Administered 2022-11-30 – 2022-12-02 (×6): 0.5 mg via ORAL
  Filled 2022-11-30 (×6): qty 1

## 2022-11-30 MED ORDER — RISPERIDONE 0.5 MG TABLET
0.2500 mg | ORAL_TABLET | Freq: Two times a day (BID) | ORAL | Status: DC
Start: 2022-11-30 — End: 2022-12-02
  Administered 2022-11-30 – 2022-12-02 (×5): 0.25 mg via ORAL
  Filled 2022-11-30 (×5): qty 1

## 2022-11-30 NOTE — Nurses Notes (Signed)
Home meds verified with pharmacy. Provider notified.

## 2022-11-30 NOTE — Nurses Notes (Signed)
Provider informed of patient's c/o anxiety and request for nerve medication. Per provider, may give 0900 Ativan dose now.

## 2022-11-30 NOTE — Nurses Notes (Signed)
Provider informed of patient's complaints of nausea and time of last dose of Zofran.

## 2022-11-30 NOTE — Nurses Notes (Signed)
 Provider informed of patient's request for sleeping medication.

## 2022-11-30 NOTE — Nurses Notes (Signed)
NG tube removed per policy. Patient tolerated well. No complications at this time.

## 2022-11-30 NOTE — Progress Notes (Signed)
[place] [facility]IC[facility]    HOSPITALIST PROGRESS NOTE    Michele Fitzgerald  Date of service: 11/30/2022  Date of Admission:  11/28/2022  Hospital Day:  LOS: 2 days     Subjective:   Patient seen in follow up for AECOPD, ileus/early SBO.  Feeling better overall today. Tolerating clear liquid diet.  Admits to passing gas.  No BM.  Breathing better today.  Still with some shortness of breath.     11/30/22  Patient seen and examined.  Feeling better today.  States she's had a bowel movement.  Denies any nausea or vomiting.  NG tube was clamped at time of today's exam.  VSS and afebrile.  Oxygen saturation ws 98% on 2L.      A focused review of symptoms was performed and is negative unless specified in HPI/subjective.    Vital Signs:  Filed Vitals:    11/30/22 1515 11/30/22 1527 11/30/22 1707 11/30/22 1919   BP: (!) 139/56 (!) 131/56  119/60   Pulse: 70 80  88   Resp: 20 18  20    Temp:  36.9 C (98.4 F)  36.4 C (97.6 F)   SpO2: 98% 94% 96% 90%        Physical Exam:  GENERAL: No acute distress, Resting comfortably, Alert and oriented x3    SKIN: warm and dry.    CARDIAC: S1 and S2, no murmurs, clicks, or gallops. No S3.    LUNGS: Diminished.  No wheezing or ronchi    GI: Abdomen soft, nontender. Good bowel sounds x 4. No organomegaly appreciated.    GU: No suprapubic tenderness. No costovertebral tenderness.    EXTREMITIES: No pedal edema noted. Distal pulses equal bilaterally.    NEUROLOGICAL: Cranial nerves grossly intact. No gross focal deficit.     Current Medications:  acetaminophen (TYLENOL) tablet, 650 mg, Oral, Q4H PRN  aluminum-magnesium hydroxide-simethicone (MAG-AL PLUS) 200-200-20 mg per 5 mL oral liquid, 15 mL, Oral, 4x/day PRN  aluminum-magnesium hydroxide-simethicone (MAG-AL PLUS) 200-200-20 mg per 5 mL oral liquid, 30 mL, Oral, Q4H PRN  aspirin chewable tablet 81 mg, 81 mg, Oral, Daily  atorvastatin (LIPITOR) tablet, 40 mg, Oral, QPM  benzonatate (TESSALON) capsule, 100 mg, Oral, Q8H  PRN  cefepime (MAXIPIME) 2 g in NS 100 mL IVPB minibag, 2 g, Intravenous, Q12H  clopidogrel (PLAVIX) 75 mg tablet, 75 mg, Oral, Daily  D5W 1/2 NS 1000 mL with potassium chloride 20 mEq premix infusion, , Intravenous, Continuous  enoxaparin PF (LOVENOX) 40 mg/0.4 mL SubQ injection, 40 mg, Subcutaneous, Q24H  FLUoxetine (PROzac) capsule, 20 mg, Oral, Daily  guaiFENesin 100mg  per 5mL oral liquid - for cough (expectorant), 200 mg, Oral, Q4H PRN  ipratropium-albuterol 0.5 mg-3 mg(2.5 mg base)/3 mL Solution for Nebulization, 3 mL, Nebulization, 4x/day  ipratropium-albuterol 0.5 mg-3 mg(2.5 mg base)/3 mL Solution for Nebulization, 3 mL, Nebulization, Q4H PRN  LORazepam (ATIVAN) tablet, 0.5 mg, Oral, 3x/day  methylPREDNISolone sod succ (SOLU-medrol) 125 mg/2 mL injection, 62.5 mg, Intravenous, Q6H  ondansetron (ZOFRAN) 2 mg/mL injection, 4 mg, Intravenous, Q6H PRN  polyethylene glycol (MIRALAX) oral packet, 17 g, Oral, Daily  ramelteon (ROZEREM) tablet, 8 mg, Oral, NIGHTLY  risperiDONE (risperDAL) tablet, 0.25 mg, Oral, 2x/day  rOPINIRole (REQUIP) tablet, 0.5 mg, Oral, 2x/day  sennosides-docusate sodium (SENOKOT-S) 8.6-50mg  per tablet, 1 Tablet, Oral, 2x/day        Labs:  CBC:     9.7, 9.7 (09/04 0251) \   11.0 (09/04 0251) /   387 (09/04  3016)      / 32.2 (09/04 0251) \          BMP:   130* (09/04 0251) 87* (09/04 0251) 23 (09/04 0251)    /     166* (09/04 0251)   4.4 (09/04 0251) 36* (09/04 0251) 0.75 (09/04 0251) \              Recent Results (from the past 24 hour(s))   COMPREHENSIVE METABOLIC PANEL, NON-FASTING    Collection Time: 11/30/22  2:51 AM   Result Value    ALKALINE PHOSPHATASE 62    ALT (SGPT) 11    AST (SGOT) 21          Radiology:  XR KUB AND UPRIGHT ABDOMEN    Result Date: 11/29/2022  Impression Gaseous distention and dilatation of small bowel which appears mildly increased. Radiologist location ID: [id number]     XR KUB    Result Date: 11/29/2022  Impression A nonobstructive bowel gas pattern is noted.  Radiologist location ID: [id number]       The laboratory results, imaging results and other diagnostic results were reviewed in the EMR.    Assessment/ Plan:   Active Hospital Problems   (*Primary Problem)    Diagnosis    Acute exacerbation of chronic obstructive pulmonary disease (CMS HCC)     AECOPD  Chronic respiratory failure with hypoxia  Ileus    Continue duonebs, steroids, antibiotics, IS, flutter valve.  OOB to chair.  Titrate oxygen to keep saturation 89-92%.   Ambulate around nursing unit .  Remove NG tube.  Patient having bowel movements. KUB improved. Advance to full liquid diet.  Continue bowel regimen.  Continue reglan     MY ORDERS LAST 24 (24h ago, onward)       Start     Ordered    11/30/22 2100  atorvastatin (LIPITOR) tablet  EVERY EVENING         11/30/22 0953    11/30/22 1400  LORazepam (ATIVAN) tablet  3 TIMES DAILY         11/30/22 0953    11/30/22 1100  risperiDONE (risperDAL) tablet  2 TIMES DAILY         11/30/22 0953    11/30/22 0915  MISCELLANEOUS MD/DO TO NURSE  UNTIL DISCONTINUED        Comments: Clamp NG tube prior to KUB    11/30/22 0907    11/30/22 0907  XR KUB AND UPRIGHT ABDOMEN  ONE TIME (IMAGING ONLY)        Comments: Clamp NG tube    11/30/22 0907    11/30/22 0530  CBC WITH DIFF  PROCEDURE ONCE         11/30/22 0016    11/30/22 0400  MANUAL DIFFERENTIAL  ONE TIME         11/30/22 0355    11/29/22 2145  PT EVALUATE AND TREAT  Per Therapist Discretion        Comments: If patient's O2 level drops, PT may increase O2 until sats are > 93%.    11/29/22 2143                    DVT/PE Prophylaxis: Enoxaparin    Disposition Planning: Home discharge      On the day of the encounter, a total of  35 minutes was spent on this patient encounter including review of historical information, examination, documentation and post-visit activities. The time documented excludes procedural time.  Linus Orn, DO FACP [facility]  11/30/2022  [place] MEDICINE HOSPITALIST

## 2022-12-01 DIAGNOSIS — J441 Chronic obstructive pulmonary disease with (acute) exacerbation: Principal | ICD-10-CM

## 2022-12-01 DIAGNOSIS — J9611 Chronic respiratory failure with hypoxia: Secondary | ICD-10-CM

## 2022-12-01 DIAGNOSIS — K56699 Other intestinal obstruction unspecified as to partial versus complete obstruction: Secondary | ICD-10-CM

## 2022-12-01 NOTE — Care Plan (Signed)
Problem: Adult Inpatient Plan of Care  Goal: Plan of Care Review  Outcome: Ongoing (see interventions/notes)  Goal: Patient-Specific Goal (Individualized)  Outcome: Ongoing (see interventions/notes)  Goal: Absence of Hospital-Acquired Illness or Injury  Outcome: Ongoing (see interventions/notes)  Intervention: Identify and Manage Fall Risk  Recent Flowsheet Documentation  Taken 12/01/2022 2024 by Theodoro Clock, RN  Safety Promotion/Fall Prevention:   activity supervised   safety round/check completed  Intervention: Prevent Skin Injury  Recent Flowsheet Documentation  Taken 12/01/2022 2000 by Theodoro Clock, RN  Body Position: supine, head elevated  Goal: Optimal Comfort and Wellbeing  Outcome: Ongoing (see interventions/notes)  Goal: Rounds/Family Conference  Outcome: Ongoing (see interventions/notes)     Problem: Skin Injury Risk Increased  Goal: Skin Health and Integrity  Outcome: Ongoing (see interventions/notes)     Problem: Fall Injury Risk  Goal: Absence of Fall and Fall-Related Injury  Outcome: Ongoing (see interventions/notes)  Intervention: Promote Injury-Free Environment  Recent Flowsheet Documentation  Taken 12/01/2022 2024 by Theodoro Clock, RN  Safety Promotion/Fall Prevention:   activity supervised   safety round/check completed     Problem: Intestinal Obstruction  Goal: Optimal Bowel Function  Outcome: Ongoing (see interventions/notes)  Intervention: Promote Bowel Function  Recent Flowsheet Documentation  Taken 12/01/2022 2000 by Theodoro Clock, RN  Positioning Assistance: Positioned/repositioned independently  Body Position: supine, head elevated  Goal: Fluid and Electrolyte Balance  Outcome: Ongoing (see interventions/notes)  Goal: Absence of Infection Signs and Symptoms  Outcome: Ongoing (see interventions/notes)  Goal: Optimize Nutrition Status  Outcome: Ongoing (see interventions/notes)  Goal: Optimal Pain Control and Function  Outcome: Ongoing (see interventions/notes)     Problem: Oral Intake  Inadequate  Goal: Improved Oral Intake  Outcome: Ongoing (see interventions/notes)     Problem: Pain Acute  Goal: Optimal Pain Control and Function  Outcome: Ongoing (see interventions/notes)     Problem: Fever  Goal: Fever: Plan of Care  Outcome: Ongoing (see interventions/notes)     Problem: Breathing Pattern Ineffective  Goal: Effective Breathing Pattern  Outcome: Ongoing (see interventions/notes)

## 2022-12-01 NOTE — Care Management Notes (Addendum)
[facility]  Care Management Initial Evaluation    Patient Name: Michele Fitzgerald  Date of Birth: [birth year]  Sex: female  Date/Time of Admission: 11/28/2022 12:51 AM  Room/Bed: 329/B  Payor: MEDICARE / Plan: MEDICARE PART A AND B / Product Type: Medicare /   Primary Care Providers:  Hill-Hurt, Myra Gianotti, PA-C, PA-C (General)    Pharmacy Info:   Preferred Pharmacy       [facility] [phone] [place], [place] - [address] AT [facility] OF [address]    [address] [place] [address]    Phone: [phone] Fax: [phone]    Hours: Not open 24 hours    [facility] - [place], [place] - [address]    [address] [place] [address]    Phone: [phone] Fax: [phone]    Hours: Not open 24 hours          Emergency Contact Info:   Extended Emergency Contact Information  Primary Emergency Contact: Jacquilyn Benoit  Home Phone: [phone]  Relation: Daughter  Preferred language: English  Interpreter needed? No  Secondary Emergency Contact: Ree Edman  Address: [address]           [place], [place] [address] [place] Phone: [phone]  Relation: Son  Preferred language: English  Interpreter needed? No    History:   Cay LATANYA HANNAFORD is a 82 y.o., female, admitted     Height/Weight: 152.4 cm (5') / 59 kg (130 lb)     LOS: 3 days   Admitting Diagnosis: COPD exacerbation (CMS HCC) [J44.1]    Assessment:      12/01/22 1142   Assessment Details   Assessment Type Admission   Date of Care Management Update 12/01/22   Readmission   Is this a readmission? No   Insurance Information/Type   Insurance type Medicare   Employment/Financial   Patient has Prescription Coverage?  Yes        Name of Insurance Coverage for Medications MEDICARE, [employer]   Financial Concerns none   Living Environment   Select an age group to open "lives with" row.  Adult   Lives With *alone, greater than 32 years of age    Living Arrangements house   Able to Return to Prior Arrangements yes   Living Arrangement Comments PATIENT STATES THAT HER FAMILY IS HER NEIGBORS AND THAT HER SON OR COUSIN WILL STAY WITH HER FOR A FEW DAYS AFTER DISCHARGE   Home Safety   Home Assessment: No Problems Identified   Home Accessibility no concerns   Custody and Legal Status   Do you have a court appointed guardian/conservator? No   Are you an emancipated minor? No   Custody Issues? No   Paternity Affidavit Requested? No   Care Management Plan   Discharge Planning Status initial meeting   Projected Discharge Date 12/01/22   Discharge plan discussed with: Patient   CM will evaluate for rehabilitation potential no   Patient choice offered to patient/family yes   Form for patient choice reviewed/signed and on chart yes   Facility or Agency Preferences [facility]   Discharge Needs Assessment   Outpatient/Agency/Support Group Needs outpatient hemodialysis   Equipment Currently Used at Home wheelchair;slide board;shower chair;percussive vest;oxygen   Equipment Needed After Discharge none   Discharge Facility/Level of Care Needs Home with Home  Health (code 6)   Transportation Available family or friend will provide   Referral Information   Admission Type inpatient   Address Verified verified-no changes   Arrived From home or self-care         Discharge Plan:   CM SPOKE WITH THE PATIENT REGARDING HER D/C GOALS. PATIENT STATES THAT She WILL RETURN HOME ALONE BUT THAT HER FAMILY LIVES IN THE SAME NEIGHBORHOOD AND She CAN CALL THEM AT ANY TIME ( HAS THEM ON SPEED DIAL AND KEEPS HER PHONE WITH HER) FOR ASSISTANCE. PATIENT STATES THAT 2 NURSES ALSO LIVE VERY CLOSE TO HER. PATIENT ALSO STATES THAT HER SON OR COUSIN WILL STAY WITH HER FOR A FEW DAYS AFTER DISCHARGE. PATIENT STATES THAT She WANTS [facility] FOR [facility]. PATIENT STATES THAT She HAS ALL THE DME CURRENTLY THAT She NEEDS. CM  CALLED [facility] TO VERIFY SERVICES OFFERED AND WILL PEND HH [facility] ORDER AND SEND REFERRAL VIA CAREPORT TO [facility]. [facility] STATES THAT IF THE PATIENT IS NOT DISCHARGED BEFORE MONDAY THEN A NEW ORDER WILL BE NEEDED RATHER THAN A RESUMPTION OF CARE ORDER.     The patient will continue to be evaluated for developing discharge needs.     Case Manager: Luiz Ochoa, SOCIAL WORKER  Phone: [phone]

## 2022-12-01 NOTE — PT Evaluation (Signed)
[facility]  [address]  [place], [address]  [phone]  (Fax) [phone]  Rehabilitation Services  Physical Therapy Inpatient Initial Evaluation    Patient Name: Michele Fitzgerald  Date of Birth: 10-21-1940  Height: Height: 152.4 cm (5')  Weight: Weight: 59 kg (130 lb)  Room/Bed: 329/B  Payor: MEDICARE / Plan: MEDICARE PART A AND B / Product Type: Medicare /       PMH:  Past Medical History:   Diagnosis Date    COPD (chronic obstructive pulmonary disease) (CMS HCC)     CVA (cerebrovascular accident) (CMS HCC)     History of MI (myocardial infarction)     HTN (hypertension)            Assessment:      (P) Pt admitted for SOB presents with generalized weakness and deconditioning in B LE, and decreased functional activity tolerance. She was able to walk 120' pushing her O2 tank, but she does require CGA for mild-moderate balance and gait instability. She will benefit from skilled PT services to improve LE strength, balance, gait, and activity tolerance.    Discharge Needs:    Equipment Recommendation: (P) TBD      The patient presents with mobility limitations due to impaired balance, impaired strength, and impaired functional activity tolerance that significantly impair/prevent patient's ability to participate in mobility-related activities of daily living (MRADLs) including  ambulation and transfers in order to safely complete, safely entering/exiting the home, in reasonable time.     Discharge Disposition: (P) home with home health  JUSTIFICATION OF DISCHARGE RECOMMENDATION   Based on current diagnosis, functional performance prior to admission, and current functional performance, this patient requires continued PT services in (P) home with home health in order to achieve significant functional improvements in these deficit areas: (P) aerobic capacity/endurance, gait, locomotion, and balance, muscle performance.    Plan:   Current Intervention: (P) balance training,  bed mobility training, gait training, home exercise program, patient/family education, stair training, strengthening, transfer training  To provide physical therapy services (P) 1x/day, minimum of 1x/week  for duration of (P) until discharge.    The risks/benefits of therapy have been discussed with the patient/caregiver and he/she is in agreement with the established plan of care.       Subjective & Objective       Past Surgical History:   Procedure Laterality Date    HX APPENDECTOMY      HX CESAREAN SECTION      HX TAH AND BSO          12/01/22 1115   Rehab Session   Document Type evaluation   General Information   Patient Profile Reviewed yes   Pertinent History of Current Functional Problem Michele Fitzgerald is a 82 y.o. female who presented to the emergency room with complaints of shortness of breath and generalized weakness for the past week.   Medical Lines Telemetry   Respiratory Status nasal cannula   Existing Precautions/Restrictions fall precautions   Mutuality/Individual Preferences   Anxieties, Fears or Concerns none voiced   Patient-Specific Goals (Include Timeframe) DC home   Plan of Care Reviewed With patient   Patient would like to participate in bedside shift report Yes   Living Environment   Lives With alone   Living Arrangements house   Home Assessment: No Problems Identified   Home Accessibility bed and bath on same level;stairs to enter home   Home Main Entrance   Stair  Railings, Main Entrance railing on right side (ascending)   Stairs Comment, Main Entrance pt only uses stairs when family is with her to help carry her O2   Surface of Stairs, Main Entrance concrete   Number of Stairs, Main Entrance five   Functional Level Prior   Ambulation 1 - assistive equipment   Transferring 1 - assistive equipment   Toileting 1 - assistive equipment   Pre Treatment Status   Pre Treatment Patient Status Patient supine in bed   Support Present Pre Treatment  None   Communication Pre Treatment  Nurse    Communication Pre Treatment Comment cleared for PT   Cognitive Assessment/Interventions   Behavior/Mood Observations behavior appropriate to situation, WNL/WFL   Orientation Status oriented x 4   Attention WNL/WFL   Pre- Treatment Vital Signs   Pre-Treatment Heart Rate (beats/min) 92   Pre SpO2 (%) 93   O2 Delivery Pre Treatment supplemental O2   Vitals Comment 2 L O2 via NC   Pre-Treatment Pain   Pretreatment Pain Rating 0/10 - no pain   RLE Assessment   RLE Assessment X-Exceptions   RLE Strength 4-/5   LLE Assessment   LLE Assessment X-Exceptions   LLE Strength 4-/5   Mobility Assessment/Training   Additional Documentation Bed Mobility Assessment/Treatment (Group);Transfer Assessment/Treatment (Group);Gait Assessment/Treatment (Group)   Bed Mobility Assessment/Treatment   Bed Mobility, Assistive Device bed rails   Scoot/Bridge Independence modified independence   Sit to Supine, Independence modified independence   Supine-Sit Independence modified independence   Transfer Assessment/Treatment   Transfer Impairments balance impaired;endurance;strength decreased   Sit-Stand-Sit, Assist Device other (see comments)  (O2 tank holder)   Transfer Safety Issues step length decreased   Shower Transfer Independence   (O2 holder)   Sit-Stand Independence contact guard assist   Stand-Sit Independence contact guard assist   Toilet Transfer Assist Device other (see comments)   Toilet Transfer Independence stand-by assistance   Gait Assessment/Treatment   Total Distance Ambulated 120   Impairments  endurance;balance impaired;strength decreased   Assistive Device  other (see comments)  (pushing O2 tank)   Distance in Feet 120'   Independence  contact guard assist   Post Treatment Status   Post Treatment Patient Status Patient supine in bed;Call light within reach;Telephone within reach;Patient safety alarm activated   Support Present Post Treatment  None   Communication Post Treatement Nurse   Communication Post Treatment Comment  PT recs DC home with home health when medically stable   Patient Effort excellent   Post-Treatment Vital Signs   Post-treatment Heart Rate (beats/min) 103   Post SpO2 (%) 92   O2 Delivery Post Treatment supplemental O2   Post-Treatment Pain   Posttreatment Pain Rating 0/10 - no pain   Physical Therapy Clinical Impression   Assessment Pt admitted for SOB presents with generalized weakness and deconditioning in B LE, and decreased functional activity tolerance. She was able to walk 120' pushing her O2 tank, but she does require CGA for mild-moderate balance and gait instability. She will benefit from skilled PT services to improve LE strength, balance, gait, and activity tolerance.   Criteria for Skilled Therapeutic yes;skilled treatment is necessary   Impairments Found (describe specific impairments) aerobic capacity/endurance;gait, locomotion, and balance;muscle performance   Functional Limitations in Following  self-care   Rehab Potential good   Therapy Frequency 1x/day;minimum of 1x/week   Predicted Duration of Therapy Intervention (days/wks) until discharge   Anticipated Equipment Needs at Discharge (PT) TBD   Anticipated Discharge  Disposition home with home health   Evaluation Complexity Justification   Patient History: Co-morbidity/factors that impact Plan of Care 3 or more that impact Plan of Care   Examination Components 4 or more Exam elements addressed;Strength;Bed mobility;Transfers;Ambulation   Presentation Stable: Uncomplicated, straight-forward, problem focused   Clinical Decision Making Low complexity   Evaluation Complexity Low complexity   Care Plan Goals   PT Rehab Goals Gait Training Goal;Stairs Training Goal;Transfer Training Goal   Planned Therapy Interventions, PT Eval   Planned Therapy Interventions (PT) balance training;bed mobility training;gait training;home exercise program;patient/family education;stair training;strengthening;transfer training   Stairs Training Goal   Stairs Training Goal,  Date Established 12/01/22   Stairs Training Goal, Time to Achieve by discharge   Stairs Training Goal, Independence Level stand-by assistance   Stairs Training Goal, Assist Device handrail, right   Stairs Training Goal, Number of Stairs to Achieve 5   Transfer Training Goal   Transfer Training Goal, Date Established 12/01/22   Transfer Training Goal, Time to Achieve by discharge   Transfer Training Goal, Activity Type all transfers   Transfer Training Goal, Current Status contact guard assist   Transfer Training Goal, Independence Level modified independence   Transfer Training Goal, Assist Device least restrictrictive assistive device   Gait Training  Goal, Distance to Achieve   Gait Training  Goal, Date Established 12/01/22   Gait Training  Goal, Time to Achieve by discharge   Gait Training  Goal, Independence Level modified independence   Gait Training  Goal, Assist Device least restricted assistive device   Gait Training  Goal, Distance to Achieve 300   Physical Therapy Time and Intention   Total PT Minutes: 20     INTERVENTION MINUTES: EVALUATION 20 minutes    EVALUATION COMPLEXITY : CLINICAL DECISION MAKING OF LOW COMPLEXITY AS INDICATED BY PMH, PHYSICAL THERAPY ASSESSMENT OF MUSCULOSKELETAL AND NEUROLOGICAL SYSTEMS AND ACTIVITY LIMITATIONS. CLINICAL PRESENTATION IS STABLE AND UNCOMPLICATED    Therapist:     Shirlean Schlein, PT  12/01/2022, 19:01

## 2022-12-01 NOTE — Progress Notes (Signed)
[place] [facility]    HOSPITALIST PROGRESS NOTE    Michele Fitzgerald  Date of service: 12/01/2022  Date of Admission:  11/28/2022  Hospital Day:  LOS: 3 days     Subjective:   Patient seen in follow up for AECOPD, ileus/early SBO.  Feeling better overall today. Tolerating clear liquid diet.  Admits to passing gas.  No BM.  Breathing better today.  Still with some shortness of breath.     11/30/22  Patient seen and examined.  Feeling better today.  States she's had a bowel movement.  Denies any nausea or vomiting.  NG tube was clamped at time of today's exam.  VSS and afebrile.  Oxygen saturation ws 98% on 2L.      12/01/22  Patient seen and examined.  No new complaints.  No abdominal pain.  States breathing is at baseline.  Walked well with physical therapy today.  VSS and afebrile.      A focused review of symptoms was performed and is negative unless specified in HPI/subjective.    Vital Signs:  Filed Vitals:    12/01/22 1144 12/01/22 1155 12/01/22 1539 12/01/22 1607   BP:  (!) 152/69  129/62   Pulse: (!) 127 (!) 110  99   Resp:  20  (!) 22   Temp:    36.8 C (98.3 F)   SpO2:  94% (!) 86% 92%        Physical Exam:  GENERAL: No acute distress, Resting comfortably, Alert and oriented x3    SKIN: warm and dry.    CARDIAC: S1 and S2, no murmurs, clicks, or gallops. No S3.    LUNGS: Diminished.  No wheezing or ronchi    GI: Abdomen soft, nontender. Good bowel sounds x 4. No organomegaly appreciated.    GU: No suprapubic tenderness. No costovertebral tenderness.    EXTREMITIES: No pedal edema noted. Distal pulses equal bilaterally.    NEUROLOGICAL: Cranial nerves grossly intact. No gross focal deficit.     Current Medications:  acetaminophen (TYLENOL) tablet, 650 mg, Oral, Q4H PRN  aluminum-magnesium hydroxide-simethicone (MAG-AL PLUS) 200-200-20 mg per 5 mL oral liquid, 15 mL, Oral, 4x/day PRN  aluminum-magnesium hydroxide-simethicone (MAG-AL PLUS) 200-200-20 mg per 5 mL oral liquid, 30 mL, Oral, Q4H  PRN  aspirin chewable tablet 81 mg, 81 mg, Oral, Daily  atorvastatin (LIPITOR) tablet, 40 mg, Oral, QPM  benzonatate (TESSALON) capsule, 100 mg, Oral, Q8H PRN  cefepime (MAXIPIME) 2 g in NS 100 mL IVPB minibag, 2 g, Intravenous, Q12H  clopidogrel (PLAVIX) 75 mg tablet, 75 mg, Oral, Daily  enoxaparin PF (LOVENOX) 40 mg/0.4 mL SubQ injection, 40 mg, Subcutaneous, Q24H  FLUoxetine (PROzac) capsule, 20 mg, Oral, Daily  guaiFENesin 100mg  per 5mL oral liquid - for cough (expectorant), 200 mg, Oral, Q4H PRN  ipratropium-albuterol 0.5 mg-3 mg(2.5 mg base)/3 mL Solution for Nebulization, 3 mL, Nebulization, 4x/day  ipratropium-albuterol 0.5 mg-3 mg(2.5 mg base)/3 mL Solution for Nebulization, 3 mL, Nebulization, Q4H PRN  LORazepam (ATIVAN) tablet, 0.5 mg, Oral, 3x/day  methylPREDNISolone sod succ (SOLU-medrol) 40 mg/mL injection, 40 mg, Intravenous, Q6H  ondansetron (ZOFRAN) 2 mg/mL injection, 4 mg, Intravenous, Q6H PRN  polyethylene glycol (MIRALAX) oral packet, 17 g, Oral, Daily  ramelteon (ROZEREM) tablet, 8 mg, Oral, NIGHTLY  risperiDONE (risperDAL) tablet, 0.25 mg, Oral, 2x/day  rOPINIRole (REQUIP) tablet, 0.5 mg, Oral, 2x/day  sennosides-docusate sodium (SENOKOT-S) 8.6-50mg  per tablet, 1 Tablet, Oral, 2x/day        Labs:  CBC:       \     /          /   \  BMP:   131* (09/04 1952) 92* (09/04 1952) 18 (09/04 1952)    /     167* (09/04 1952)   3.8 (09/04 1952) 37* (09/04 1952) 0.68 (09/04 1952) \              No results found for this or any previous visit (from the past 24 hour(s)).         Radiology:  XR KUB AND UPRIGHT ABDOMEN    Result Date: 11/30/2022  Impression 1. Overall findings appear improved when compared to 11/29/2022 with relatively mild residual gaseous distention of several loops small bowel and some gas in the colon. 2. Nasogastric tube remains in place. 3. No pneumoperitoneum is identified. Radiologist location ID: [id number]       The laboratory results, imaging results and other diagnostic  results were reviewed in the EMR.    Assessment/ Plan:   Active Hospital Problems   (*Primary Problem)    Diagnosis    Acute exacerbation of chronic obstructive pulmonary disease (CMS HCC)     AECOPD  Chronic respiratory failure with hypoxia  Ileus    Continue duonebs, wean steroids, antibiotics, IS, flutter valve.  Titrate oxygen to keep saturation 89-92%.   Ambulate around nursing unit .  Advance to bland diet today.  Continue bowel regimen.  Likely discharge to home tomorrow.      MY ORDERS LAST 24 (24h ago, onward)       Start     Ordered    12/01/22 0815  DIET BLAND Do you want to initiate MNT Protocol? Yes  ALL MEALS         12/01/22 0808    11/30/22 2100  methylPREDNISolone sod succ (SOLU-medrol) 40 mg/mL injection  (COPD)  EVERY 6 HOURS         11/30/22 1929    11/30/22 1945  BASIC METABOLIC PANEL  ONE TIME         11/30/22 1930    11/30/22 1930  DIET FULL LIQUID Do you want to initiate MNT Protocol? Yes  ALL MEALS,   Status:  Canceled         11/30/22 1928    11/30/22 1930  ACTIVITY  UNTIL DISCONTINUED         11/30/22 1930                    DVT/PE Prophylaxis: Enoxaparin    Disposition Planning: Home discharge      On the day of the encounter, a total of  35 minutes was spent on this patient encounter including review of historical information, examination, documentation and post-visit activities. The time documented excludes procedural time.     Linus Orn, DO FACP [facility]  12/01/2022  [place] MEDICINE HOSPITALIST

## 2022-12-02 MED ORDER — ONDANSETRON 4 MG DISINTEGRATING TABLET
4.0000 mg | ORAL_TABLET | Freq: Three times a day (TID) | ORAL | 0 refills | Status: AC | PRN
Start: 2022-12-02 — End: 2022-12-09

## 2022-12-02 MED ORDER — BENZONATATE 100 MG CAPSULE
100.0000 mg | ORAL_CAPSULE | Freq: Three times a day (TID) | ORAL | 0 refills | Status: AC | PRN
Start: 2022-12-02 — End: 2022-12-09

## 2022-12-02 NOTE — Care Plan (Signed)
Problem: Adult Inpatient Plan of Care  Goal: Plan of Care Review  Outcome: Ongoing (see interventions/notes)  Goal: Patient-Specific Goal (Individualized)  Outcome: Ongoing (see interventions/notes)  Flowsheets (Taken 12/02/2022 0900)  Individualized Care Needs: monitor  Anxieties, Fears or Concerns: none voiced at this time  Patient-Specific Goals (Include Timeframe): dc home when stable  Plan of Care Reviewed With: patient  Goal: Absence of Hospital-Acquired Illness or Injury  Outcome: Ongoing (see interventions/notes)  Intervention: Identify and Manage Fall Risk  Recent Flowsheet Documentation  Taken 12/02/2022 0900 by Haynes Dage, RN  Safety Promotion/Fall Prevention: activity supervised  Intervention: Prevent Skin Injury  Recent Flowsheet Documentation  Taken 12/02/2022 0900 by Haynes Dage, RN  Skin Protection: adhesive use limited  Taken 12/02/2022 0800 by Haynes Dage, RN  Body Position: supine, head elevated  Goal: Optimal Comfort and Wellbeing  Outcome: Ongoing (see interventions/notes)  Goal: Rounds/Family Conference  Outcome: Ongoing (see interventions/notes)     Problem: Skin Injury Risk Increased  Goal: Skin Health and Integrity  Outcome: Ongoing (see interventions/notes)  Intervention: Optimize Skin Protection  Recent Flowsheet Documentation  Taken 12/02/2022 0900 by Haynes Dage, RN  Pressure Reduction Techniques: Frequent weight shifting encouraged  Pressure Reduction Devices: Repositioning wedges/pillows utilized  Skin Protection: adhesive use limited     Problem: Fall Injury Risk  Goal: Absence of Fall and Fall-Related Injury  Outcome: Ongoing (see interventions/notes)  Intervention: Promote Injury-Free Environment  Recent Flowsheet Documentation  Taken 12/02/2022 0900 by Haynes Dage, RN  Safety Promotion/Fall Prevention: activity supervised     Problem: Intestinal Obstruction  Goal: Optimal Bowel Function  Outcome: Ongoing (see interventions/notes)  Intervention: Promote Bowel  Function  Recent Flowsheet Documentation  Taken 12/02/2022 0800 by Haynes Dage, RN  Positioning Assistance: Positioned/repositioned independently  Body Position: supine, head elevated  Goal: Fluid and Electrolyte Balance  Outcome: Ongoing (see interventions/notes)  Goal: Absence of Infection Signs and Symptoms  Outcome: Ongoing (see interventions/notes)  Goal: Optimize Nutrition Status  Outcome: Ongoing (see interventions/notes)  Goal: Optimal Pain Control and Function  Outcome: Ongoing (see interventions/notes)     Problem: Oral Intake Inadequate  Goal: Improved Oral Intake  Outcome: Ongoing (see interventions/notes)     Problem: Pain Acute  Goal: Optimal Pain Control and Function  Outcome: Ongoing (see interventions/notes)     Problem: Fever  Goal: Fever: Plan of Care  Outcome: Ongoing (see interventions/notes)     Problem: Breathing Pattern Ineffective  Goal: Effective Breathing Pattern  Outcome: Ongoing (see interventions/notes)    Patient is awake, alert and oriented. Liquid bowel movement today.

## 2022-12-02 NOTE — Care Management Notes (Deleted)
[facility]  Care Management Note    Patient Name: Michele Fitzgerald  Date of Birth: Dec 08, 1940  Sex: female  Date/Time of Admission: 11/28/2022 12:51 AM  Room/Bed: 329/B  Payor: MEDICARE / Plan: MEDICARE PART A AND B / Product Type: Medicare /    LOS: 4 days   Primary Care Providers:  Hill-Hurt, Myra Gianotti, PA-C, PA-C (General)    Admitting Diagnosis:  COPD exacerbation (CMS HCC) [J44.1]    Assessment:       Discharge Plan:  Home with Hospice (code 50)  CM SENT HH ROC ORDER VIA CAREPORT TO [facility]. A NEW ORDER WILL NEED TO BE SENT IF THE PATIENT DOESN'T D/C BEFORE MONDAY.    The patient will continue to be evaluated for developing discharge needs.     Case Manager: Luiz Ochoa, SOCIAL WORKER  Phone: [phone]

## 2022-12-02 NOTE — Nurses Notes (Signed)

## 2022-12-02 NOTE — Care Management Notes (Signed)
[facility]  Care Management Note    Patient Name: Michele Fitzgerald  Date of Birth: Oct 05, 1940  Sex: female  Date/Time of Admission: 11/28/2022 12:51 AM  Room/Bed: 329/B  Payor: MEDICARE / Plan: MEDICARE PART A AND B / Product Type: Medicare /    LOS: 4 days   Primary Care Providers:  Hill-Hurt, Myra Gianotti, PA-C, PA-C (General)    Admitting Diagnosis:  COPD exacerbation (CMS HCC) [J44.1]    Assessment:       Discharge Plan:  Home with Home Health (code 6)  CM SENT HH ROC ORDER VIA CAREPORT TO [facility]. A NEW ORDER WILL NEED TO BE SENT IF THE PATIENT DOESN'T D/C BEFORE MONDAY.    The patient will continue to be evaluated for developing discharge needs.     Case Manager: Luiz Ochoa, SOCIAL WORKER  Phone: [phone]

## 2022-12-02 NOTE — Discharge Summary (Signed)
[facility]     DISCHARGE SUMMARY      PATIENT NAME:  Michele Fitzgerald   MRN:  [id number]  DOB:  05-04-1940    INPATIENT ADMISSION DATE: 11/28/2022   DATE OF DISCHARGE:  12/02/22     ATTENDING PHYSICIAN: Linus Orn, DO     PRIMARY CARE PHYSICIAN: Myra Gianotti Hill-Hurt, PA-C     HOSPITAL PRESENTATION:    Please see full admission H&P for details.      As per HPI:    Michele Fitzgerald is a 82 y.o. female who presented to [facility] due to worsening shortness of breath.    Patient has a longstanding history of chronic obstructive pulmonary disease.  She wears 2-3 liters chronically at home and admits to smoking 1/2 pack of cigarettes daily.   Patient was seen in the emergency room; 3 days ago for same complaint.   Patient admits to having a mild nonproductive cough but denies any fever or chills.  She also admitted to having some nausea, but denied any vomiting.   She was also had generalized weakness.  While in the ER the patient was found to have a mildly distended abdomen and an ileus vs early SBO was found on KUB.  An NG tube was placed and her nausea resolved.  The patient was admitted to the hospitalist service for AECOPD and ileus.            FURTHER HOSPITAL COURSE WITH DISCHARGE DIAGNOSES:    The patient's condition improved with IV steroids, duonebs, cefepime, mucinex, incentive spirometry and flutter valve.  The ileus resolved and she was having bowel movements prior to discharge.  She ambulated without difficulty with physical therapy.  She was eager to be discharged on 12/02/22 and was discharged home in stable and improved condition.        Problem List:  Active Hospital Problems   (*Primary Problem)    Diagnosis    Acute exacerbation of chronic obstructive pulmonary disease (CMS HCC)     AECOPD  Chronic respiratory failure with hypoxia  Ileus           PHYSICAL EXAM   DAY OF DISCHARGE:    BP (!) 140/58   Pulse 80   Temp 36.4 C (97.6 F)   Resp (!)  24   Ht 1.524 m (5')   Wt 59 kg (130 lb)   SpO2 96%   BMI 25.39 kg/m          General:  Patient is resting in bed, no acute distress, alert and oriented x3   Eyes:  PERRL, no scleral icterus   HENT:  Normocephalic, atraumatic, oral mucosa is moist and pink, no nasal discharge   Heart:  RRR, S1 and S2 auscultated, no murmurs appreciated   Lungs:  Unlabored respirations.  Lungs are clear to auscultation bilaterally, no wheezes, no rales  Abdomen:  Soft, active bowel sounds, non-tender to palpation, non-distended  Extremities:  Pulses equal in all extremities bilaterally.  Capillary refill less than 3 seconds.  No edema in lower extremities bilaterally   Skin:  Warm and dry.  Not diaphoretic  Neuro:  A&O x 3.  No focal deficits.  Speech intact.  Not tremulous  Psych:  Cooperative, not agitated    LABS:  CBC with Diff (Last 48 Hours):  No results for input(s): "WBC", "HGB", "HCT", "MCV", "PLTCNT", "BANDS", "PMNS", "LYMPHO", "LYMPHOCYTES", "MONOCYTES", "EOSINO", "EOSINOPHIL", "BASOPHILS" in the last 48 hours.  Last BMP  (Last result in the past 48 hours)        Na   K   Cl   CO2   BUN   Cr   Calcium   Glucose   Glucose-Fasting        11/30/22 1952 131   3.8   92   37   18   0.68   8.7   167                  BMP (Last 48 Hours):    Recent Results in last 48 hours     11/30/22  1952   SODIUM 131*   POTASSIUM 3.8   CHLORIDE 92*   CO2 37*   BUN 18   CREATININE 0.68   CALCIUM 8.7   GLUCOSENF 167*          IMAGING:         DISCHARGE MEDICATIONS:     Current Discharge Medication List        START taking these medications.        Details   ondansetron 4 mg Tablet, Rapid Dissolve  Commonly known as: ZOFRAN ODT   4 mg, Oral, EVERY 8 HOURS PRN  Qty: 12 Tablet  Refills: 0            CONTINUE these medications - NO CHANGES were made during your visit.        Details   albuterol sulfate 90 mcg/actuation oral inhaler  Commonly known as: PROVENTIL or VENTOLIN or PROAIR   1-2 Puffs, Inhalation, EVERY 6 HOURS PRN  Refills: 0      ascorbic acid (vitamin C) 500 mg Tablet  Commonly known as: VITAMIN C   500 mg, Oral, DAILY  Refills: 0     aspirin 81 mg Tablet, Delayed Release (E.C.)  Commonly known as: ECOTRIN   81 mg, Oral, DAILY  Refills: 0     budesonide-glycopyr-formoterol 160-9-4.8 mcg/actuation HFA Aerosol Inhaler  Commonly known as: BREZTRI AEROSPHERE   2 Puffs, Inhalation, 2 TIMES DAILY  Refills: 0     clopidogreL 75 mg Tablet  Commonly known as: PLAVIX   75 mg, Oral, DAILY  Refills: 0     cyanocobalamin 1,000 mcg Tablet  Commonly known as: VITAMIN B 12   1,000 mcg, Oral, DAILY  Refills: 0     FLUoxetine 20 mg Capsule  Commonly known as: PROzac   20 mg, Oral, DAILY  Qty: 30 Capsule  Refills: 2     ipratropium-albuteroL 0.5 mg-3 mg(2.5 mg base)/3 mL nebulizer solution  Commonly known as: DUONEB   3 mL, Nebulization, EVERY 6 HOURS PRN  Refills: 0     loratadine 10 mg Tablet  Commonly known as: CLARITIN   10 mg, Oral, DAILY  Refills: 0     LORazepam 0.5 mg Tablet  Commonly known as: ATIVAN   0.5 mg, Oral, 3 TIMES DAILY  Refills: 0     risperiDONE 0.25 mg Tablet  Commonly known as: risperDAL   0.25 mg, Oral, 2 TIMES DAILY  Refills: 0     rOPINIRole 0.5 mg Tablet  Commonly known as: REQUIP   0.5 mg, Oral, 2 TIMES DAILY  Qty: 60 Tablet  Refills: 3     rosuvastatin 20 mg Tablet  Commonly known as: CRESTOR   20 mg, Oral, EVERY EVENING  Refills: 0            STOP taking these medications.  doxycycline hyclate 100 mg Capsule  Commonly known as: VIBRAMYCIN     metoprolol tartrate 25 mg Tablet  Commonly known as: LOPRESSOR                 DISCHARGE DISPOSITION:  Home    DISCHARGE INSTRUCTIONS:        Refer to Home Health - EXTERNAL                Copies sent to Care Team         Relationship Specialty Notifications Start End    Hill-Hurt, Myra Gianotti, [place] PCP - General PHYSICIAN ASSISTANT  05/18/21     Phone: [phone] Fax: [phone]         [address] [place] [address]    Renaye Rakers, MD  PULMONARY DISEASE  10/07/22      Phone: [phone] Fax: [phone]         [address] [place] [address]            >30 minutes total were spent coordinating discharge day today      Linus Orn, DO  [facility] MEDICINE HOSPITALIST

## 2022-12-03 LAB — ADULT ROUTINE BLOOD CULTURE, SET OF 2 BOTTLES (BACTERIA AND YEAST)
BLOOD CULTURE, ROUTINE: NO GROWTH
BLOOD CULTURE, ROUTINE: NO GROWTH

## 2022-12-05 ENCOUNTER — Telehealth (HOSPITAL_COMMUNITY): Payer: Self-pay

## 2022-12-05 NOTE — Care Management Notes (Signed)
Referral Information  ++++++ Placed Provider #1 ++++++  Case Manager: Vicki Johnson  Provider Type: Home Health - Return  Provider Name: [facility] [place] ([id number])  Address:  [address]  [address]  [place], [place] [id number]  Contact: TERESA WAGNER    Phone: [phone] x  Fax:   Fax: [phone]

## 2022-12-21 ENCOUNTER — Other Ambulatory Visit: Payer: Self-pay

## 2022-12-21 ENCOUNTER — Other Ambulatory Visit: Payer: Medicare Other | Attending: Family Medicine

## 2022-12-21 DIAGNOSIS — N390 Urinary tract infection, site not specified: Secondary | ICD-10-CM | POA: Insufficient documentation

## 2022-12-21 LAB — URINALYSIS, MACROSCOPIC
BILIRUBIN: NEGATIVE mg/dL
BLOOD: 0.03 mg/dL
GLUCOSE: NEGATIVE mg/dL
KETONES: NEGATIVE mg/dL
LEUKOCYTES: NEGATIVE WBCs/uL
NITRITE: NEGATIVE
PH: 6.5 (ref 5.0–9.0)
PROTEIN: NEGATIVE mg/dL
SPECIFIC GRAVITY: 1.014 (ref 1.002–1.030)
UROBILINOGEN: NORMAL mg/dL

## 2022-12-21 LAB — URINALYSIS, MICROSCOPIC
HYALINE CASTS: 1 /lpf — ABNORMAL HIGH (ref <0)
RBCS: 3 /hpf (ref <4)
SQUAMOUS EPITHELIAL: <1 /hpf (ref <28)
WBCS: <1 /hpf (ref <6)

## 2023-01-30 ENCOUNTER — Other Ambulatory Visit (HOSPITAL_COMMUNITY): Payer: Self-pay | Admitting: Family Medicine

## 2023-01-30 ENCOUNTER — Other Ambulatory Visit: Payer: Self-pay

## 2023-01-30 ENCOUNTER — Ambulatory Visit
Admission: RE | Admit: 2023-01-30 | Discharge: 2023-01-30 | Disposition: A | Discharge status: Home / Self Care | Payer: Medicare Other | Source: Ambulatory Visit | Attending: Family Medicine | Admitting: Family Medicine

## 2023-01-30 DIAGNOSIS — H938X3 Other specified disorders of ear, bilateral: Secondary | ICD-10-CM | POA: Insufficient documentation

## 2023-01-30 DIAGNOSIS — R079 Chest pain, unspecified: Secondary | ICD-10-CM

## 2023-06-06 ENCOUNTER — Emergency Department
Admission: EM | Admit: 2023-06-06 | Discharge: 2023-06-07 | Disposition: A | Discharge status: Home / Self Care | Attending: Emergency Medicine | Admitting: Emergency Medicine

## 2023-06-06 ENCOUNTER — Emergency Department (HOSPITAL_COMMUNITY)

## 2023-06-06 ENCOUNTER — Other Ambulatory Visit: Payer: Self-pay

## 2023-06-06 ENCOUNTER — Encounter (HOSPITAL_COMMUNITY): Payer: Self-pay

## 2023-06-06 DIAGNOSIS — R0789 Other chest pain: Secondary | ICD-10-CM

## 2023-06-06 DIAGNOSIS — R0689 Other abnormalities of breathing: Secondary | ICD-10-CM | POA: Insufficient documentation

## 2023-06-06 DIAGNOSIS — F17210 Nicotine dependence, cigarettes, uncomplicated: Secondary | ICD-10-CM

## 2023-06-06 DIAGNOSIS — J441 Chronic obstructive pulmonary disease with (acute) exacerbation: Secondary | ICD-10-CM | POA: Insufficient documentation

## 2023-06-06 DIAGNOSIS — D72829 Elevated white blood cell count, unspecified: Secondary | ICD-10-CM | POA: Insufficient documentation

## 2023-06-06 DIAGNOSIS — F17200 Nicotine dependence, unspecified, uncomplicated: Secondary | ICD-10-CM | POA: Insufficient documentation

## 2023-06-06 DIAGNOSIS — Z91199 Patient's noncompliance with other medical treatment and regimen due to unspecified reason: Secondary | ICD-10-CM | POA: Insufficient documentation

## 2023-06-06 DIAGNOSIS — I498 Other specified cardiac arrhythmias: Secondary | ICD-10-CM | POA: Insufficient documentation

## 2023-06-06 DIAGNOSIS — I493 Ventricular premature depolarization: Secondary | ICD-10-CM | POA: Insufficient documentation

## 2023-06-06 DIAGNOSIS — Z515 Encounter for palliative care: Secondary | ICD-10-CM | POA: Insufficient documentation

## 2023-06-06 DIAGNOSIS — Z66 Do not resuscitate: Secondary | ICD-10-CM | POA: Insufficient documentation

## 2023-06-06 DIAGNOSIS — Z1152 Encounter for screening for COVID-19: Secondary | ICD-10-CM | POA: Insufficient documentation

## 2023-06-06 LAB — COMPREHENSIVE METABOLIC PANEL, NON-FASTING
ALBUMIN/GLOBULIN RATIO: 1.6 — ABNORMAL HIGH (ref 0.8–1.4)
ALBUMIN: 4.1 g/dL (ref 3.5–5.7)
ALKALINE PHOSPHATASE: 72 U/L (ref 34–104)
ALT (SGPT): 12 U/L (ref 7–52)
ANION GAP: 9 mmol/L (ref 4–13)
AST (SGOT): 10 U/L — ABNORMAL LOW (ref 13–39)
BILIRUBIN TOTAL: 0.5 mg/dL (ref 0.3–1.0)
BUN/CREA RATIO: 42 — ABNORMAL HIGH (ref 6–22)
BUN: 31 mg/dL — ABNORMAL HIGH (ref 7–25)
CALCIUM, CORRECTED: 9.1 mg/dL (ref 8.9–10.8)
CALCIUM: 9.2 mg/dL (ref 8.6–10.3)
CHLORIDE: 90 mmol/L — ABNORMAL LOW (ref 98–107)
CO2 TOTAL: 42 mmol/L (ref 21–31)
CREATININE: 0.73 mg/dL (ref 0.60–1.30)
ESTIMATED GFR: 82 mL/min/{1.73_m2} (ref >59)
GLOBULIN: 2.6 (ref 2.0–3.5)
GLUCOSE: 96 mg/dL (ref 74–109)
OSMOLALITY, CALCULATED: 288 mosm/kg (ref 270–290)
POTASSIUM: 4.1 mmol/L (ref 3.5–5.1)
PROTEIN TOTAL: 6.7 g/dL (ref 6.4–8.9)
SODIUM: 141 mmol/L (ref 136–145)

## 2023-06-06 LAB — BLOOD GAS/CO-OX
%FIO2 (ARTERIAL): 25 %
BASE EXCESS (ARTERIAL): 15.8 mmol/L — ABNORMAL HIGH (ref 0.0–3.0)
BICARBONATE (ARTERIAL): 37.3 mmol/L — ABNORMAL HIGH (ref 21.0–28.0)
CARBOXYHEMOGLOBIN: 2.2 % (ref <=3.0)
HEMATOCRITRT: 30 % — ABNORMAL LOW (ref 37–50)
HEMOGLOBIN: 10.1 g/dL — ABNORMAL LOW (ref 12.0–18.0)
MET-HEMOGLOBIN: <0.7 % (ref <=1.5)
O2 SATURATION (ARTERIAL): 98.7 % (ref 94.0–98.0)
O2CT: 14 %
OXYHEMOGLOBIN: 97.2 % — ABNORMAL HIGH (ref 90.0–95.0)
PAO2/FIO2 RATIO: 496
PCO2 (ARTERIAL): 78 mmHg (ref 35–45)
PH (ARTERIAL): 7.36 (ref 7.35–7.45)
PO2 (ARTERIAL): 124 mmHg — ABNORMAL HIGH (ref 83–108)

## 2023-06-06 LAB — COVID-19, FLU A/B, RSV RAPID BY PCR
INFLUENZA VIRUS TYPE A: NOT DETECTED
INFLUENZA VIRUS TYPE B: NOT DETECTED
RESPIRATORY SYNCTIAL VIRUS (RSV): NOT DETECTED
SARS-CoV-2: NOT DETECTED

## 2023-06-06 LAB — TROPONIN-I
TROPONIN I: 25 ng/L — ABNORMAL HIGH (ref <15)
TROPONIN I: 24 ng/L — ABNORMAL HIGH (ref <15)

## 2023-06-06 LAB — PTT (PARTIAL THROMBOPLASTIN TIME): APTT: 24.9 s — ABNORMAL LOW (ref 25.0–38.0)

## 2023-06-06 LAB — CBC WITH DIFF
BASOPHIL #: 0 10*3/uL (ref 0.00–0.10)
BASOPHIL %: 0 % (ref 0–1)
EOSINOPHIL #: 0.1 10*3/uL (ref 0.00–0.50)
EOSINOPHIL %: 0 % — ABNORMAL LOW (ref 1–7)
HCT: 33.3 % (ref 31.2–41.9)
HGB: 10.4 g/dL — ABNORMAL LOW (ref 10.9–14.3)
LYMPHOCYTE #: 2.3 10*3/uL (ref 1.10–3.10)
LYMPHOCYTE %: 13 % — ABNORMAL LOW (ref 16–46)
MCH: 24.6 pg — ABNORMAL LOW (ref 24.7–32.8)
MCHC: 31.1 g/dL — ABNORMAL LOW (ref 32.3–35.6)
MCV: 79.3 fL (ref 75.5–95.3)
MONOCYTE #: 1 10*3/uL — ABNORMAL HIGH (ref 0.20–0.90)
MONOCYTE %: 6 % (ref 4–11)
MPV: 5.8 fL — ABNORMAL LOW (ref 7.9–10.8)
NEUTROPHIL #: 14.2 10*3/uL — ABNORMAL HIGH (ref 1.90–8.20)
NEUTROPHIL %: 81 % — ABNORMAL HIGH (ref 43–77)
PLATELETS: 664 10*3/uL — ABNORMAL HIGH (ref 140–440)
RBC: 4.2 10*6/uL (ref 3.63–4.92)
RDW: 15.4 % (ref 12.3–17.7)
WBC: 17.6 10*3/uL — ABNORMAL HIGH (ref 3.8–11.8)

## 2023-06-06 LAB — PT/INR
INR: 0.85 (ref 0.84–1.10)
PROTHROMBIN TIME: 9.6 s — ABNORMAL LOW (ref 9.8–12.7)

## 2023-06-06 LAB — LIPASE: LIPASE: 22 U/L (ref 11–82)

## 2023-06-06 LAB — LACTIC ACID LEVEL W/ REFLEX FOR LEVEL >2.0: LACTIC ACID: 1 mmol/L (ref 0.5–2.2)

## 2023-06-06 LAB — THYROID STIMULATING HORMONE (SENSITIVE TSH): TSH: 1.795 u[IU]/mL (ref 0.450–5.330)

## 2023-06-06 LAB — MAGNESIUM: MAGNESIUM: 2.4 mg/dL (ref 1.9–2.7)

## 2023-06-06 MED ORDER — SODIUM CHLORIDE 0.9 % INTRAVENOUS PIGGYBACK
2.0000 g | INTRAVENOUS | Status: AC
Start: 2023-06-06 — End: 2023-06-06
  Administered 2023-06-06: 2 g via INTRAVENOUS
  Administered 2023-06-06: 0 g via INTRAVENOUS

## 2023-06-06 MED ORDER — ONDANSETRON HCL (PF) 4 MG/2 ML INJECTION SOLUTION
4.0000 mg | INTRAMUSCULAR | Status: AC
Start: 2023-06-06 — End: 2023-06-06
  Administered 2023-06-06: 4 mg via INTRAVENOUS

## 2023-06-06 MED ORDER — CEFTRIAXONE 2 GRAM SOLUTION FOR INJECTION
INTRAMUSCULAR | Status: AC
Start: 2023-06-06 — End: 2023-06-06
  Filled 2023-06-06: qty 20

## 2023-06-06 MED ORDER — ONDANSETRON HCL (PF) 4 MG/2 ML INJECTION SOLUTION
INTRAMUSCULAR | Status: AC
Start: 2023-06-06 — End: 2023-06-06
  Filled 2023-06-06: qty 2

## 2023-06-06 NOTE — ED Triage Notes (Signed)
 To ED via [facility] with c/o chest pain, SOB, and syncopal episode this am.   Chest pain resolved. Pt continues to c/o SOB.     Pt referred by [facility]     EMS: duoneb, 02 4L/min, 22g IV left wrist, glucose 120, 10 mg dexamethasone

## 2023-06-06 NOTE — ED Provider Notes (Signed)
 [facility]  ED Primary Provider Note        Arrival: The patient arrived by Ambulance     History of Present Illness   chief complaint  Michele Fitzgerald is a 83 y.o. female who had concerns including Chest Pain , Shortness of Breath, and Syncope.  Patient 83 year old female that presents emergency room by EMS with a chief complaint of chest pain earlier this evening, chronic shortness of breath, and 2 episodes of syncope/near-syncope earlier in the morning.  Is denying any chest pain at the present time.  Patient is on hospice continues to smoke.  Not very compliant with her breathing treatments.  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  She DNR DNI.  However family wants her fully worked up at this time for her chest pain chronic shortness of breath.  Patient reportedly had upper respiratory illness recently and is completed a Z-Pak.  Patient reportedly has been smoking today.  Pain is earlier in the evening was sharp/dull in nature over lower anterior chest wall.  Again this is resolved.  No alleviating or aggravating factors.  Shortness of breath did resolve with DuoNeb by EMS.  Blood pressure 169/89 with a heart rate of 71, respiratory rate of 24 with an oxygen saturation 100% on 4 L via nasal cannula.  Afebrile at 97.7.  Patient did receive Decadron 10 mg along with a DuoNeb by EMS.  All nursing notes reviewed        Review of Systems     No other overt Review of Systems are noted to be positive except noted in the HPI.    { Be sure to review and modify ROS as appropriate. As of Mar 28, 2021 you are only required to have a "medically appropriate" ROS and PE for billing purposes. This help text will disappear when signing your note.:123}  Historical Data   History Reviewed This Encounter: Medical History  Surgical History  Family History  Social History      Physical Exam   ED Triage Vitals [06/06/23 2106]   BP (Non-Invasive) (!) 169/89   Heart  Rate 71   Respiratory Rate (!) 24   Temperature 36.5 C (97.7 F)   SpO2 100 %   Weight 63.5 kg (140 lb)   Height 1.524 m (5')     {Be sure to review and modify Physical Exam as appropriate. As of Mar 28, 2021 you are only required to have a "medically appropriate" ROS and PE for billing purposes. This help text will disappear when signing your note.:123}    Exam:   Constitutional:  Patient alert orient x3; is dyspneic.  This is reportedly chronic.  Currently receiving DuoNeb on EMS stretcher.  No limitations.  Head: Atraumatic normocephalic  Eyes :  Pupils are equal round reactive to light and accommodation extraocular muscles are intact.  Sclera and conjunctiva are unremarkable  Ears:  Tympanic membranes are pearly gray bilaterally; external auditory canals are unremarkable; external ears without any lesions  Nose:  Nares are patent turbinates are pink and moist  Mouth:  Mucosa is pink and moist without lesions.  Posterior pharynx is pink and moist without hypertrophy/exudate.  Edentulous  Neck:  Soft and supple without palpable lymphadenopathy.  Heart:  Regular rate and rhythm with a 1/6 holosystolic ejection murmur   Lungs:  Decreased breath sounds with the bilateral expiratory wheeze.  Bilateral basilar crackles  Abdomen:  Soft nontender without any rebound or guarding; positive  bowel sounds throughout  Genitalia:  Deferred  Skin:  Warm and dry without lesions.  Normal skin turgor.  Brisk capillary refill distally  Extremities:  Good strength bilaterally with full range of motion of upper and lower extremities.  1+ pitting edema to bilateral lower extremity  Neuro:  Alert oriented x3.  Cranial nerves II-XII grossly intact as tested.  Excellent sensation distally over all dermatomes.  Psychiatric:  Patient cooperative, affect appropriate        Procedures           Patient Data   {Click here to open the ED Workup Activity for clinical data review *This link will automatically disappear upon signing your  note*:123}Labs Ordered/Reviewed - No data to display    No orders to display       Medical Decision Making     ED clinical impression missing, please click on the following link to add Clinical Impressions ***  then refresh the note prior to signing.  {Be sure to fill out the MDM SmartBlock in Notewriter to the left. Do not modify this italicized text, it will disappear upon signing your note:123}  Medical Decision Making      ED Course as of 06/06/23 2312   Tue Jun 06, 2023   2202 EKG shows sinus rhythm with a heart rate of 82, normal axis, normal R-wave progression, occasional PVC, normal PR/QRS interval.   2208 PH 7.36 with a pCO2 of 78 with a PaO2 of 124.  O2 sat 98.7 on 1 L. patient placed on BiPAP   2306 Patient took BiPAP off.   2310 White count 17.6 with a hemoglobin of 10.4, hematocrit of 33.3, platelet/664.   2310 Sodium 141 with a potassium of 4.1, chloride is 90 with a bicarb of 42.  BUN of 31 with a creatinine of 0.73   2310 Initial troponin 25, repeat troponin 24   2310 Lactic 1.0 with a TSH of 1.7   2311 PT 9.6 with an INR 0.8, PTT 25 point              {Disposition:38159}    {Critical Care Time (Optional):37527}           ED clinical impression missing, please click on the following link to add Clinical Impressions ***  then refresh the note prior to signing.      Current Discharge Medication List          R.A. Jamieson Hetland, DO  Department of Emergency Medicine              {Remember to refresh your note prior to signing. Use Control + F11 or click the refresh button at the bottom of the note. This reminder text will automatically disappear when you sign your note.:123}

## 2023-06-06 NOTE — Respiratory Therapy (Signed)
 PT could not tolerate the BiPAP-placed back on NC-Dr Nadine Counts notified.

## 2023-06-06 NOTE — Respiratory Therapy (Signed)
 Latest Reference Range & Units 06/06/23 21:27   %FIO2 (ARTERIAL) % 25   PH 7.35 - 7.45  7.36   PCO2 35 - 45 mm/Hg 78 (HH)   PO2 83 - 108 mm/Hg 124 (H)   BICARBONATE 21.0 - 28.0 mmol/L 37.3 (H)   BASE EXCESS 0.0 - 3.0 mmol/L 15.8 (H)   PAO2/FIO2 RATIO  496   O2CT % 14.0   O2 SATURATION (ARTERIAL) 94.0 - 98.0 % 98.7   HEMATOCRITRT 37 - 50 % 30 (L)   CARBOXYHEMOGLOBIN <=3.0 % 2.2   HEMOGLOBIN 12.0 - 18.0 g/dL 84.6 (L)   MET-HEMOGLOBIN <=1.5 % <0.7   O2HB 90.0 - 95.0 % 97.2 (H)   (HH): Data is critically high  (H): Data is abnormally high  (L): Data is abnormally low  Results given to Dr Nadine Counts

## 2023-06-07 DIAGNOSIS — I498 Other specified cardiac arrhythmias: Secondary | ICD-10-CM

## 2023-06-07 DIAGNOSIS — R0602 Shortness of breath: Secondary | ICD-10-CM

## 2023-06-07 DIAGNOSIS — I493 Ventricular premature depolarization: Secondary | ICD-10-CM

## 2023-06-07 LAB — ECG 12 LEAD
Atrial Rate: 82 {beats}/min
Calculated P Axis: 88 degrees
Calculated R Axis: 83 degrees
Calculated T Axis: 62 degrees
PR Interval: 124 ms
QRS Duration: 60 ms
QT Interval: 352 ms
QTC Calculation: 411 ms
Ventricular rate: 82 {beats}/min

## 2023-06-07 LAB — BLOOD GAS/CO-OX
%FIO2 (ARTERIAL): 28 %
BASE EXCESS (ARTERIAL): 14.5 mmol/L — ABNORMAL HIGH (ref 0.0–3.0)
BICARBONATE (ARTERIAL): 36.2 mmol/L — ABNORMAL HIGH (ref 21.0–28.0)
CARBOXYHEMOGLOBIN: 2.2 % (ref <=3.0)
HEMATOCRITRT: 29 % — ABNORMAL LOW (ref 37–50)
HEMOGLOBIN: 9.6 g/dL — ABNORMAL LOW (ref 12.0–18.0)
MET-HEMOGLOBIN: 1.1 % (ref <=1.5)
O2 SATURATION (ARTERIAL): 94.3 % (ref 94.0–98.0)
O2CT: 12.8 %
OXYHEMOGLOBIN: 93.9 % (ref 90.0–95.0)
PAO2/FIO2 RATIO: 268
PCO2 (ARTERIAL): 75 mmHg (ref 35–45)
PH (ARTERIAL): 7.36 (ref 7.35–7.45)
PO2 (ARTERIAL): 75 mmHg — ABNORMAL LOW (ref 83–108)

## 2023-06-07 LAB — TROPONIN-I: TROPONIN I: 22 ng/L — ABNORMAL HIGH (ref <15)

## 2023-06-07 MED ORDER — CEFDINIR 300 MG CAPSULE
300.0000 mg | ORAL_CAPSULE | Freq: Two times a day (BID) | ORAL | 0 refills | Status: AC
Start: 2023-06-07 — End: 2023-06-14

## 2023-06-07 NOTE — Discharge Instructions (Addendum)
 Follow up with the hospice for recheck this week  Continue to take steroids as prescribed  Take cefdinir twice a day for the next 7 days as prescribed  Do not smoke  Wear BiPAP as much as possible  Return to emergency room for any chest pain, shortness of breath, or any concerns

## 2023-06-07 NOTE — Respiratory Therapy (Signed)
 Latest Reference Range & Units 06/07/23 01:45   %FIO2 (ARTERIAL) % 28   PH 7.35 - 7.45  7.36   PCO2 35 - 45 mm/Hg 75 (HH)   PO2 83 - 108 mm/Hg 75 (L)   BICARBONATE 21.0 - 28.0 mmol/L 36.2 (H)   BASE EXCESS 0.0 - 3.0 mmol/L 14.5 (H)   PAO2/FIO2 RATIO  268   O2CT % 12.8   O2 SATURATION (ARTERIAL) 94.0 - 98.0 % 94.3   HEMATOCRITRT 37 - 50 % 29 (L)   CARBOXYHEMOGLOBIN <=3.0 % 2.2   HEMOGLOBIN 12.0 - 18.0 g/dL 9.6 (L)   MET-HEMOGLOBIN <=1.5 % 1.1   O2HB 90.0 - 95.0 % 93.9   (HH): Data is critically high  (L): Data is abnormally low  (H): Data is abnormally high    Results given to Dr Nadine Counts

## 2023-06-11 LAB — ADULT ROUTINE BLOOD CULTURE, SET OF 2 BOTTLES (BACTERIA AND YEAST)
BLOOD CULTURE, ROUTINE: NO GROWTH
BLOOD CULTURE, ROUTINE: NO GROWTH

## 2023-06-27 DEATH — deceased
